# Patient Record
Sex: Male | Born: 1965 | Race: White | Hispanic: No | Marital: Married | Smoking: Current some day smoker
Health system: Southern US, Community
[De-identification: ages and names within clinical notes are randomized; demographics above are authoritative.]

## PROBLEM LIST (undated history)

## (undated) DIAGNOSIS — J449 Chronic obstructive pulmonary disease, unspecified: Secondary | ICD-10-CM

---

## 2016-12-27 ENCOUNTER — Emergency Department (HOSPITAL_COMMUNITY)
Admission: EM | Admit: 2016-12-27 | Discharge: 2016-12-27 | Disposition: A | Discharge status: Home / Self Care | Payer: Self-pay | Attending: Emergency Medicine | Admitting: Emergency Medicine

## 2016-12-27 ENCOUNTER — Emergency Department (HOSPITAL_COMMUNITY): Payer: Self-pay

## 2016-12-27 ENCOUNTER — Encounter (HOSPITAL_COMMUNITY): Payer: Self-pay

## 2016-12-27 DIAGNOSIS — M791 Myalgia: Secondary | ICD-10-CM | POA: Insufficient documentation

## 2016-12-27 DIAGNOSIS — Z720 Tobacco use: Secondary | ICD-10-CM | POA: Insufficient documentation

## 2016-12-27 DIAGNOSIS — J441 Chronic obstructive pulmonary disease with (acute) exacerbation: Secondary | ICD-10-CM | POA: Insufficient documentation

## 2016-12-27 DIAGNOSIS — Z79899 Other long term (current) drug therapy: Secondary | ICD-10-CM | POA: Insufficient documentation

## 2016-12-27 DIAGNOSIS — R509 Fever, unspecified: Secondary | ICD-10-CM | POA: Insufficient documentation

## 2016-12-27 LAB — BRAIN NATRIURETIC PEPTIDE: B NATRIURETIC PEPTIDE 5: 20 pg/mL (ref 0.0–100.0)

## 2016-12-27 LAB — COMPREHENSIVE METABOLIC PANEL
ALK PHOS: 55 U/L (ref 38–126)
ALT: 12 U/L — AB (ref 17–63)
AST: 17 U/L (ref 15–41)
Albumin: 3.8 g/dL (ref 3.5–5.0)
Anion gap: 9 (ref 5–15)
BUN: 12 mg/dL (ref 6–20)
CALCIUM: 9.1 mg/dL (ref 8.9–10.3)
CHLORIDE: 103 mmol/L (ref 101–111)
CO2: 26 mmol/L (ref 22–32)
CREATININE: 0.85 mg/dL (ref 0.61–1.24)
Glucose, Bld: 106 mg/dL — ABNORMAL HIGH (ref 65–99)
Potassium: 3.7 mmol/L (ref 3.5–5.1)
Sodium: 138 mmol/L (ref 135–145)
Total Bilirubin: 0.6 mg/dL (ref 0.3–1.2)
Total Protein: 7.6 g/dL (ref 6.5–8.1)

## 2016-12-27 LAB — CBC WITH DIFFERENTIAL/PLATELET
BASOS ABS: 0 10*3/uL (ref 0.0–0.1)
Basophils Relative: 0 %
Eosinophils Absolute: 0 10*3/uL (ref 0.0–0.7)
Eosinophils Relative: 0 %
HCT: 39.9 % (ref 39.0–52.0)
Hemoglobin: 14 g/dL (ref 13.0–17.0)
LYMPHS ABS: 1.9 10*3/uL (ref 0.7–4.0)
LYMPHS PCT: 11 %
MCH: 31.7 pg (ref 26.0–34.0)
MCHC: 35.1 g/dL (ref 30.0–36.0)
MCV: 90.5 fL (ref 78.0–100.0)
Monocytes Absolute: 3 10*3/uL — ABNORMAL HIGH (ref 0.1–1.0)
Monocytes Relative: 17 %
NEUTROS ABS: 13 10*3/uL — AB (ref 1.7–7.7)
Neutrophils Relative %: 72 %
PLATELETS: 273 10*3/uL (ref 150–400)
RBC: 4.41 MIL/uL (ref 4.22–5.81)
RDW: 12.6 % (ref 11.5–15.5)
WBC: 17.9 10*3/uL — AB (ref 4.0–10.5)

## 2016-12-27 LAB — TROPONIN I

## 2016-12-27 MED ORDER — PREDNISONE 20 MG PO TABS: 40.0000 mg | ORAL_TABLET | Freq: Every day | ORAL | 0 refills | Status: DC

## 2016-12-27 MED ORDER — DOXYCYCLINE HYCLATE 100 MG PO CAPS: 100.0000 mg | ORAL_CAPSULE | Freq: Two times a day (BID) | ORAL | 0 refills | Status: DC

## 2016-12-27 MED ORDER — METHYLPREDNISOLONE SODIUM SUCC 125 MG IJ SOLR
125.0000 mg | Freq: Once | INTRAMUSCULAR | Status: AC
  Administered 2016-12-27: 125 mg via INTRAVENOUS
  Filled 2016-12-27: qty 2

## 2016-12-27 MED ORDER — ALBUTEROL SULFATE HFA 108 (90 BASE) MCG/ACT IN AERS
2.0000 | INHALATION_SPRAY | RESPIRATORY_TRACT | Status: AC
  Administered 2016-12-27: 2 via RESPIRATORY_TRACT
  Filled 2016-12-27: qty 6.7

## 2016-12-27 MED ORDER — ALBUTEROL SULFATE (2.5 MG/3ML) 0.083% IN NEBU
5.0000 mg | INHALATION_SOLUTION | Freq: Once | RESPIRATORY_TRACT | Status: AC
  Administered 2016-12-27: 5 mg via RESPIRATORY_TRACT
  Filled 2016-12-27: qty 6

## 2016-12-27 MED ORDER — ALBUTEROL (5 MG/ML) CONTINUOUS INHALATION SOLN: 10.0000 mg/h | INHALATION_SOLUTION | RESPIRATORY_TRACT | Status: AC

## 2016-12-27 MED ORDER — DOXYCYCLINE HYCLATE 100 MG PO TABS
100.0000 mg | ORAL_TABLET | Freq: Once | ORAL | Status: AC
  Administered 2016-12-27: 100 mg via ORAL
  Filled 2016-12-27: qty 1

## 2016-12-27 NOTE — ED Notes (Signed)
Pt placed on 2L 02 [place] for 02 sat 92%.

## 2016-12-27 NOTE — Discharge Instructions (Signed)
Albuterol, 2 puffs every 4 hours for the next 24 hours, then every 4 hours as needed  Doxycycline, 100 mg by mouth twice daily for 10 days  Prednisone, 40 mg by mouth daily for 5 days.  Return for worsening stymtoms  RESOURCE GUIDE  [facility] Doctor List    Kari BaarsEdward Hawkins MD. Specialty: Pulmonary Disease Contact information: [address]  [address]  [place] [address]  [phone]   Syliva OvermanMargaret Simpson, MD. Specialty: [facility] Contact information: [address]  [place] [address]  [phone]   Lilyan PuntScott Luking, MD. Specialty: [facility] Contact information: [address]  [address]  [place] [address]  [phone]   Avon Gullyesfaye Fanta, MD Specialty: Internal Medicine Contact information: [address]  [place] [address]  [phone]   Catalina PizzaZach Hall, MD. Specialty: Internal Medicine Contact information: [address]  [place] [address]  [phone]   Butch PennyAngus Mcinnis, MD. Specialty: Family Medicine Contact information: [address]  [place] [address]  [phone]   John GiovanniStephen Knowlton, MD. Specialty: [facility] Contact information: [address]  [address]  [place] [address]  [phone]   Carylon Perchesoy Fagan, MD. Specialty: Internal Medicine Contact information: [address]  [address]  [place] [address]  [phone]

## 2016-12-27 NOTE — ED Notes (Signed)
To radiology

## 2016-12-27 NOTE — ED Triage Notes (Signed)
Reports of generalized fatigue, cough and fevers. Patient reports of feeling short of breath x2 days. Sats 92% on RA in triage.

## 2016-12-27 NOTE — ED Notes (Signed)
Pt reports SOB x 1 week- states that he is followed by urgent cares-[facility] currently

## 2016-12-27 NOTE — ED Provider Notes (Signed)
[facility] DEPT Provider Note   CSN: [id number] Arrival date & time: 12/27/16  1519     History   Chief Complaint Chief Complaint  Patient presents with  . Shortness of Breath  . Fever    HPI Jerry Schaefer is a 51 y.o. male.  HPI  The patient is a 51 year old male, he has a long-time history of smoking, approximately 60 pack years 30 years of smoking 2 packs per day. He denies any other significant medical problems. He presents after having increasing shortness of breath over the last week. This started as a dry cough, progressed and having some nasal congestion and drainage and is now having a productive cough, headache, body aches and fevers as high as 102 though today there were 99.5. There is no swelling of the legs, no vomiting but he does have a poor appetite. The symptoms are persistent, gradually worsening, nothing seems to make it better or worse, he has tried taking Goody powders at home and feels like it might help.  History reviewed. No pertinent past medical history.  There are no active problems to display for this patient.   History reviewed. No pertinent surgical history.     Home Medications    Prior to Admission medications   Medication Sig Start Date End Date Taking? Authorizing Provider  albuterol (PROVENTIL HFA;VENTOLIN HFA) 108 (90 Base) MCG/ACT inhaler Inhale 1-2 puffs into the lungs every 6 (six) hours as needed for wheezing or shortness of breath.    Yes [provider]  Aspirin-Acetaminophen-Caffeine (GOODY HEADACHE PO) Take 2 packets by mouth 4 (four) times daily as needed (pain).    Yes [provider]  naproxen sodium (ANAPROX) 220 MG tablet Take 440 mg by mouth every 4 (four) hours as needed.    Yes [provider]  doxycycline (VIBRAMYCIN) 100 MG capsule Take 1 capsule (100 mg total) by mouth 2 (two) times daily. 12/27/16   Eber HongMiller, Shreeya Recendiz, MD  predniSONE (DELTASONE) 20 MG tablet Take 2 tablets (40 mg total) by mouth  daily. 12/27/16   Eber HongMiller, Florencia Zaccaro, MD    Family History History reviewed. No pertinent family history.  Social History Social History  Substance Use Topics  . Smoking status: Current Every Day Smoker    Packs/day: 2.00    Types: Cigarettes  . Smokeless tobacco: Never Used  . Alcohol use No     Allergies   Patient has no known allergies.   Review of Systems Review of Systems  All other systems reviewed and are negative.    Physical Exam Updated Vital Signs BP 103/72 (BP Location: Left Arm)   Pulse 80   Temp 98.2 F (36.8 C) (Oral)   Resp 16   Ht 5\' 9"  (1.753 m)   Wt 61.2 kg (135 lb)   SpO2 94%   BMI 19.94 kg/m   Physical Exam  Constitutional: He appears well-developed and well-nourished. No distress.  HENT:  Head: Normocephalic and atraumatic.  Mouth/Throat: Oropharynx is clear and moist. No oropharyngeal exudate.  Eyes: Conjunctivae and EOM are normal. Pupils are equal, round, and reactive to light. Right eye exhibits no discharge. Left eye exhibits no discharge. No scleral icterus.  Neck: Normal range of motion. Neck supple. No JVD present. No thyromegaly present.  No JVD  Cardiovascular: Normal rate, regular rhythm, normal heart sounds and intact distal pulses.  Exam reveals no gallop and no friction rub.   No murmur heard. Pulse of 95, strong pulses at the radial arteries, no peripheral edema  Pulmonary/Chest: No respiratory distress. He has wheezes. He has no rales.  Wheezing on forced expiration, decreased lung sounds bilaterally, speaks in full sentences, no increased work of breathing or accessory muscle use  Abdominal: Soft. Bowel sounds are normal. He exhibits no distension and no mass. There is no tenderness.  Soft nontender abdomen with no hepatosplenic megaly  Musculoskeletal: Normal range of motion. He exhibits no edema or tenderness.  Lymphadenopathy:    He has no cervical adenopathy.  Neurological: He is alert. Coordination normal.  Skin: Skin is  warm and dry. No rash noted. No erythema.  Psychiatric: He has a normal mood and affect. His behavior is normal.  Nursing note and vitals reviewed.    ED Treatments / Results  Labs (all labs ordered are listed, but only abnormal results are displayed) Labs Reviewed  CBC WITH DIFFERENTIAL/PLATELET - Abnormal; Notable for the following:       Result Value   WBC 17.9 (*)    Neutro Abs 13.0 (*)    Monocytes Absolute 3.0 (*)    All other components within normal limits  COMPREHENSIVE METABOLIC PANEL - Abnormal; Notable for the following:    Glucose, Bld 106 (*)    ALT 12 (*)    All other components within normal limits  BRAIN NATRIURETIC PEPTIDE  TROPONIN I    EKG  EKG Interpretation  Date/Time:  Friday December 27 2016 15:33:29 EDT Ventricular Rate:  96 PR Interval:    QRS Duration: 95 QT Interval:  346 QTC Calculation: 438 R Axis:   90 Text Interpretation:  Sinus rhythm Borderline right axis deviation ECG OTHERWISE WITHIN NORMAL LIMITS No old tracing to compare Confirmed by Eber Hong ([id number]) on 12/27/2016 4:01:21 PM       Radiology Dg Chest 2 View  Result Date: 12/27/2016 CLINICAL DATA:  Productive cough and fever x1 week. Dyspnea x2 days. Generalized fatigue. EXAM: CHEST  2 VIEW COMPARISON:  None. FINDINGS: The heart size and mediastinal contours are within normal limits. Minimal aortic atherosclerosis at the arch. Emphysematous hyperinflation of the lungs. No pneumonic consolidation, effusion or pneumothorax. No pulmonary edema. Degenerative changes are seen along the dorsal spine. No acute nor suspicious osseous abnormality. IMPRESSION: Emphysematous hyperinflation of the lungs.  Aortic atherosclerosis. Electronically Signed   By: Tollie Eth M.D.   On: 12/27/2016 17:20    Procedures Procedures (including critical care time)  Medications Ordered in ED Medications  albuterol (PROVENTIL,VENTOLIN) solution continuous neb (10 mg/hr Nebulization Not Given 12/27/16 1600)    albuterol (PROVENTIL HFA;VENTOLIN HFA) 108 (90 Base) MCG/ACT inhaler 2 puff (not administered)  albuterol (PROVENTIL) (2.5 MG/3ML) 0.083% nebulizer solution 5 mg (5 mg Nebulization Given 12/27/16 1559)  methylPREDNISolone sodium succinate (SOLU-MEDROL) 125 mg/2 mL injection 125 mg (125 mg Intravenous Given 12/27/16 1617)  doxycycline (VIBRA-TABS) tablet 100 mg (100 mg Oral Given 12/27/16 1832)     Initial Impression / Assessment and Plan / ED Course  I have reviewed the triage vital signs and the nursing notes.  Pertinent labs & imaging results that were available during my care of the patient were reviewed by me and considered in my medical decision making (see chart for details).    The patient's exam is consistent with COPD, his lung exam is also consistent with that, his chest x-ray is also consistent with that.  CBC reveals a leukocytosis with a slight leftward shift. Given his increased phlegm production will treat with doxycycline, prednisone, albuterol. He has been given an inhaler and a spacer  prior to discharge. The patient expresses understanding of all of the results, he appears stable for discharge. He is not requiring oxygen  Final Clinical Impressions(s) / ED Diagnoses   Final diagnoses:  COPD exacerbation (HCC)    New Prescriptions New Prescriptions   DOXYCYCLINE (VIBRAMYCIN) 100 MG CAPSULE    Take 1 capsule (100 mg total) by mouth 2 (two) times daily.   PREDNISONE (DELTASONE) 20 MG TABLET    Take 2 tablets (40 mg total) by mouth daily.     Eber Hong, MD 12/27/16 [place]

## 2016-12-27 NOTE — ED Notes (Signed)
EKG given to Dr. Miller.  

## 2016-12-27 NOTE — ED Notes (Signed)
Amublated pt around nurses station. Pulse ox remained consistent at 90% during ambulation

## 2016-12-27 NOTE — ED Notes (Signed)
Dr Miller in to assess 

## 2016-12-27 NOTE — ED Notes (Signed)
From radiology 

## 2019-02-19 ENCOUNTER — Ambulatory Visit (HOSPITAL_COMMUNITY)
Admission: RE | Admit: 2019-02-19 | Discharge: 2019-02-19 | Disposition: A | Discharge status: Home / Self Care | Payer: PRIVATE HEALTH INSURANCE | Source: Ambulatory Visit | Attending: Pulmonary Disease | Admitting: Pulmonary Disease

## 2019-02-19 ENCOUNTER — Other Ambulatory Visit: Payer: Self-pay

## 2019-02-19 ENCOUNTER — Other Ambulatory Visit (HOSPITAL_COMMUNITY): Payer: Self-pay | Admitting: Pulmonary Disease

## 2019-02-19 DIAGNOSIS — J42 Unspecified chronic bronchitis: Secondary | ICD-10-CM | POA: Diagnosis present

## 2020-07-13 IMAGING — DX CHEST - 2 VIEW
2 series · 2 of 2 positions shown · non-contrast
Comparison: 12/27/2016

CLINICAL DATA: Shortness of breath.  History of COPD.

EXAM:
CHEST - 2 VIEW

[chest pa]
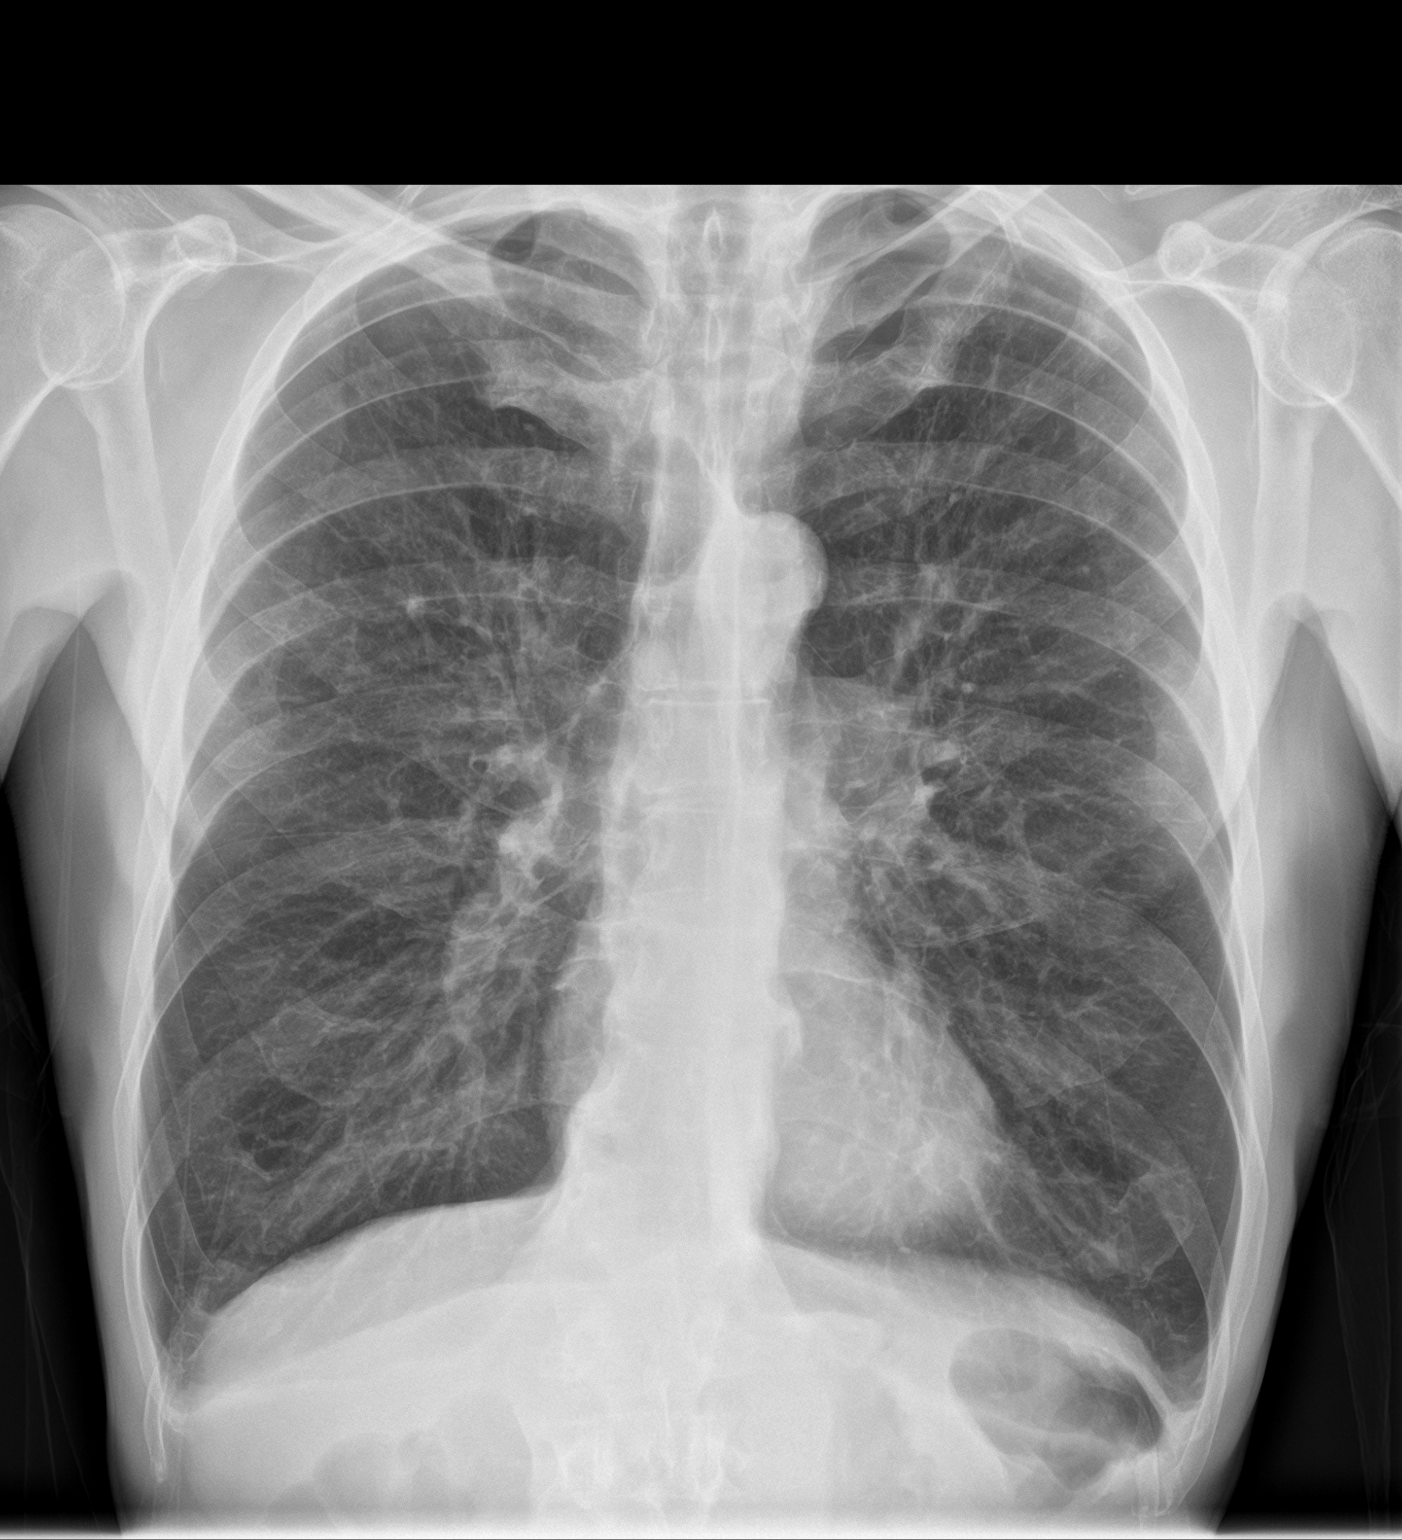

[chest lat]
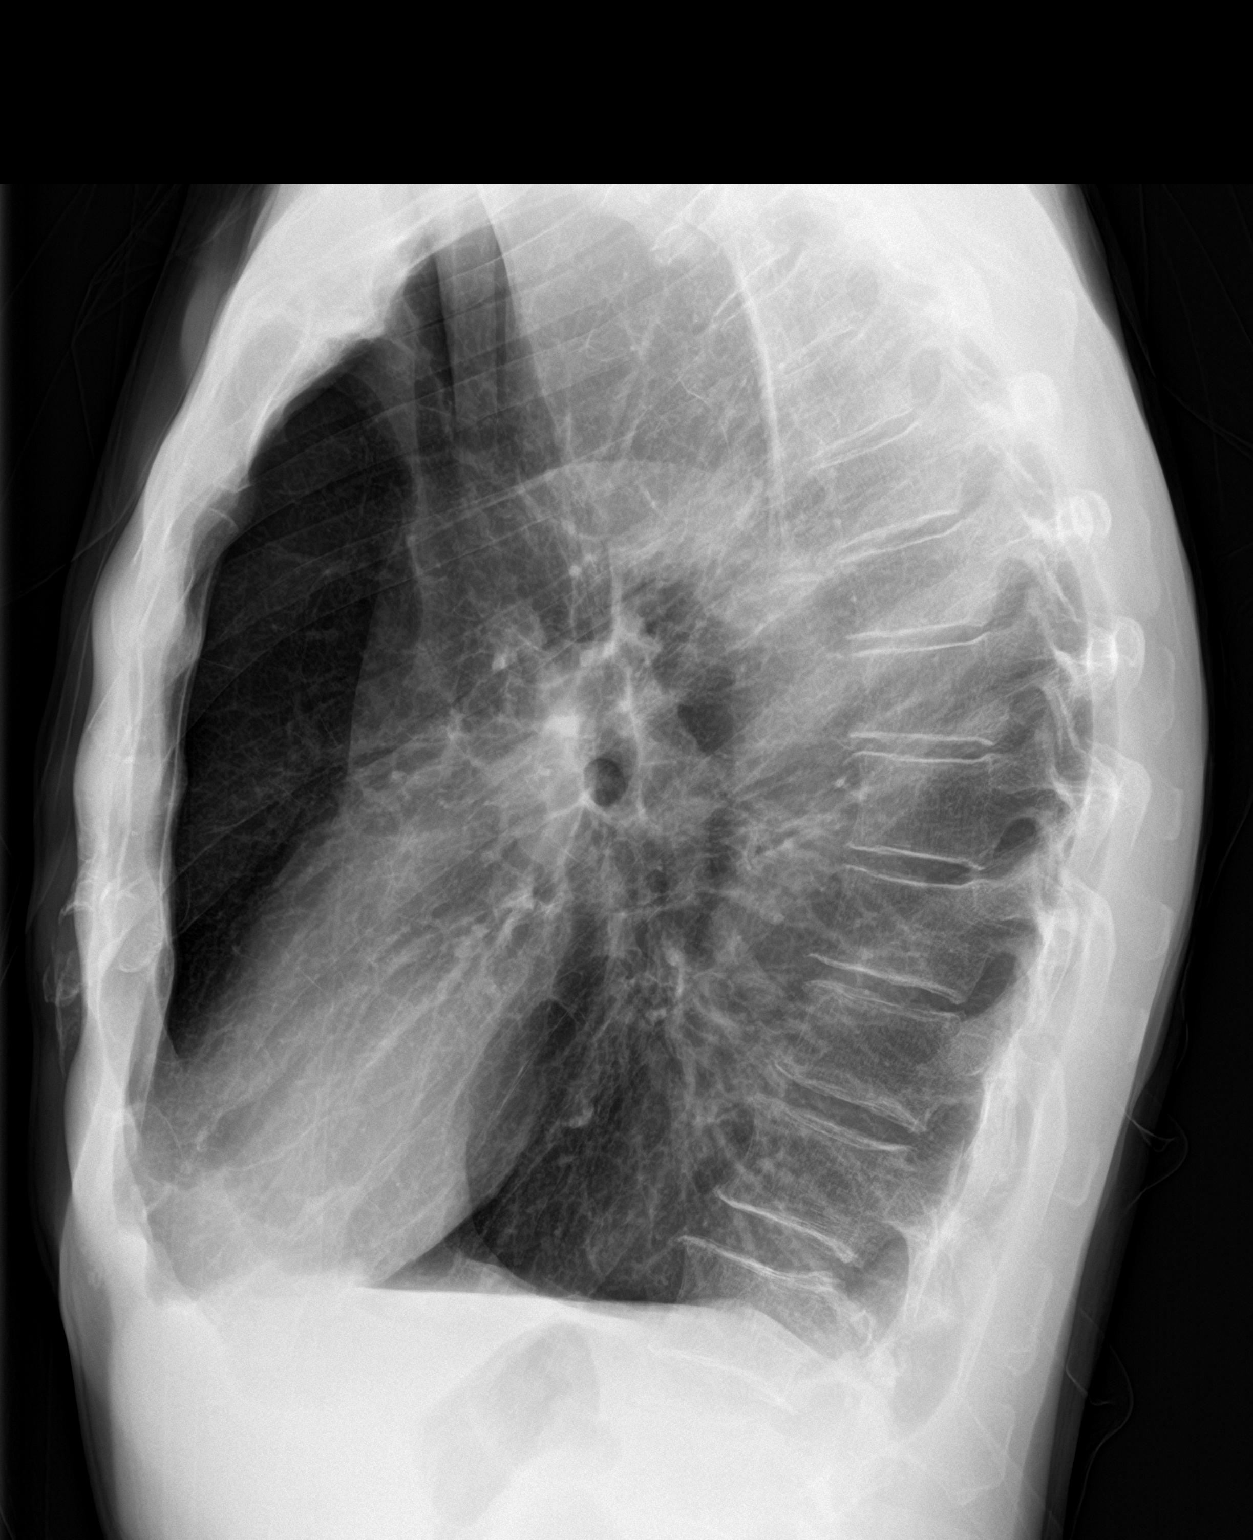

[2 of 2 positions shown; findings below may reference images not displayed]

FINDINGS: Cardiomediastinal silhouette is unchanged.

COPD/emphysema changes again noted.

There is no evidence of focal airspace disease, pulmonary edema,
suspicious pulmonary nodule/mass, pleural effusion, or pneumothorax.

No acute bony abnormalities are identified.
IMPRESSION: COPD/emphysema without evidence of acute cardiopulmonary disease.

## 2021-12-25 ENCOUNTER — Encounter: Payer: Self-pay | Admitting: Family Medicine

## 2021-12-25 ENCOUNTER — Ambulatory Visit (INDEPENDENT_AMBULATORY_CARE_PROVIDER_SITE_OTHER): Payer: BC Managed Care – PPO | Admitting: Family Medicine

## 2021-12-25 VITALS — BP 114/62 | HR 88 | Ht 69.0 in | Wt 117.2 lb

## 2021-12-25 DIAGNOSIS — Z1159 Encounter for screening for other viral diseases: Secondary | ICD-10-CM | POA: Diagnosis not present

## 2021-12-25 DIAGNOSIS — J449 Chronic obstructive pulmonary disease, unspecified: Secondary | ICD-10-CM | POA: Diagnosis not present

## 2021-12-25 DIAGNOSIS — Z23 Encounter for immunization: Secondary | ICD-10-CM | POA: Diagnosis not present

## 2021-12-25 DIAGNOSIS — R7301 Impaired fasting glucose: Secondary | ICD-10-CM | POA: Diagnosis not present

## 2021-12-25 DIAGNOSIS — E559 Vitamin D deficiency, unspecified: Secondary | ICD-10-CM

## 2021-12-25 DIAGNOSIS — Z1211 Encounter for screening for malignant neoplasm of colon: Secondary | ICD-10-CM

## 2021-12-25 DIAGNOSIS — Z114 Encounter for screening for human immunodeficiency virus [HIV]: Secondary | ICD-10-CM

## 2021-12-25 DIAGNOSIS — F17210 Nicotine dependence, cigarettes, uncomplicated: Secondary | ICD-10-CM | POA: Insufficient documentation

## 2021-12-25 DIAGNOSIS — J441 Chronic obstructive pulmonary disease with (acute) exacerbation: Secondary | ICD-10-CM | POA: Insufficient documentation

## 2021-12-25 DIAGNOSIS — Z720 Tobacco use: Secondary | ICD-10-CM

## 2021-12-25 MED ORDER — ALBUTEROL SULFATE HFA 108 (90 BASE) MCG/ACT IN AERS: 1.0000 | INHALATION_SPRAY | Freq: Four times a day (QID) | RESPIRATORY_TRACT | 2 refills | Status: DC | PRN

## 2021-12-25 MED ORDER — AZITHROMYCIN 250 MG PO TABS: 500.0000 mg | ORAL_TABLET | Freq: Every day | ORAL | 0 refills | Status: AC

## 2021-12-25 MED ORDER — PREDNISONE 20 MG PO TABS: 40.0000 mg | ORAL_TABLET | Freq: Every day | ORAL | 0 refills | Status: DC

## 2021-12-25 NOTE — Patient Instructions (Addendum)
I appreciate the opportunity to provide care to you today!    Follow up:  3 months  Labs: please stop by the lab today to get your blood drawn (CBC, CMP, TSH, Lipid profile, HgA1c, Vit D)  Screening: HIV,  Hep C  Please pick up your medications at the pharmacy   Thank you for getting your Tdap vaccine     Please continue to a heart-healthy diet and increase your physical activities. Try to exercise for 64mins at least three times a week.      It was a pleasure to see you and I look forward to continuing to work together on your health and well-being. Please do not hesitate to call the office if you need care or have questions about your care.   Have a wonderful day and week. With Gratitude, Alvira Monday MSN, FNP-BC

## 2021-12-25 NOTE — Assessment & Plan Note (Signed)
-   Will treat with a 5-day course of prednisone and 4-days course of antibiotics -Refill albuterol inhaler -will place a referral to pulmonary for follow-up

## 2021-12-25 NOTE — Assessment & Plan Note (Signed)
-  Reports 40 years history. He used to smoke 2 packs daily but has cut back to 15 cigarettes daily.   Smokes about 15 cigarettes daily  Asked about quitting: confirms that he/she currently smokes cigarettes Advise to quit smoking: Educated about QUITTING to reduce the risk of cancer, cardio and cerebrovascular disease. Assess willingness: Unwilling to quit at this time, but is working on cutting back. Assist with counseling and pharmacotherapy: Counseled for 5 minutes and literature provided. Arrange for follow up: follow up in 3 months and continue to offer help.

## 2021-12-25 NOTE — Progress Notes (Addendum)
New Patient Office Visit  Subjective:  Patient ID: Jerry Schaefer, male    DOB: [birth year]  Age: 56 y.o. MRN: [id number]  CC:  Chief Complaint  Patient presents with   New Patient (Initial Visit)    Pt would like to discuss COPD, has been having flare up.     HPI Jerry Schaefer is a 56 y.o. male with past medical history of COPD presents for establishing care. COPD exacerbation:  reports having trouble breathing for a while, stating that he can burn up an inhaler in a week and needs refills. He notes worsening of his respiratory symptoms beyond typical day-to-day variations, reporting increased dyspnea, cough, and wheezing. He mentions using his nebulizer with moderate relief. He denies fever and chills, noting sleep disturbances due to current episodes. He reports seeing his pulmonologist, Dr. Luan Pulling but he has not been followed up since 2020.   Smoker: 40 years history. He used to smoke 2 packs daily but has cut back to 15 cigarettes daily.   He declined the shingles vaccine day but will be taking the Tdap vaccine.     History reviewed. No pertinent past medical history.  History reviewed. No pertinent surgical history.  History reviewed. No pertinent family history.  Social History   Socioeconomic History   Marital status: Married    Spouse name: Not on file   Number of children: Not on file   Years of education: Not on file   Highest education level: Not on file  Occupational History   Not on file  Tobacco Use   Smoking status: Every Day    Packs/day: 0.50    Types: Cigarettes   Smokeless tobacco: Never  Substance and Sexual Activity   Alcohol use: No   Drug use: No   Sexual activity: Not on file  Other Topics Concern   Not on file  Social History Narrative   Not on file   Social Determinants of Health   Financial Resource Strain: Not on file  Food Insecurity: Not on file  Transportation Needs: Not on file  Physical Activity: Not on file  Stress: Not  on file  Social Connections: Not on file  Intimate Partner Violence: Not on file    ROS Review of Systems  Constitutional:  Positive for fatigue. Negative for chills and fever.  HENT:  Negative for sinus pressure, sinus pain, sneezing and sore throat.   Eyes:  Negative for photophobia, pain and redness.  Respiratory:  Positive for cough, chest tightness, shortness of breath and wheezing.   Cardiovascular:  Negative for chest pain, palpitations and leg swelling.  Gastrointestinal:  Negative for diarrhea and vomiting.  Endocrine: Negative for polydipsia and polyphagia.  Musculoskeletal:  Negative for back pain and neck pain.  Skin:  Negative for rash.  Neurological:  Negative for dizziness, seizures and speech difficulty.  Hematological:  Does not bruise/bleed easily.  Psychiatric/Behavioral:  Positive for sleep disturbance.    Objective:   Today's Vitals: BP 114/62   Pulse 88   Ht _0  (1.753 m)   Wt 117 lb 3.2 oz (53.2 kg)   SpO2 90%   BMI 17.31 kg/m   Physical Exam Constitutional:      Appearance: Normal appearance.  HENT:     Head: Normocephalic.     Right Ear: External ear normal.     Left Ear: External ear normal.     Nose: No congestion or rhinorrhea.     Mouth/Throat:     Mouth: Mucous membranes  are moist.  Eyes:     Extraocular Movements: Extraocular movements intact.     Pupils: Pupils are equal, round, and reactive to light.  Cardiovascular:     Rate and Rhythm: Normal rate and regular rhythm.     Pulses: Normal pulses.     Heart sounds: Normal heart sounds.  Pulmonary:     Effort: Pulmonary effort is normal.     Breath sounds: Wheezing (Wheezing on forced expiration, decreased lung sounds bilaterally, speaks in complete sentences, no increased work of breathing or accessory muscle use) present.  Abdominal:     Palpations: Abdomen is soft.  Musculoskeletal:     Cervical back: No rigidity or tenderness.     Right lower leg: No edema.     Left lower leg:  No edema.  Skin:    Capillary Refill: Capillary refill takes less than 2 seconds.     Findings: No bruising or lesion.  Neurological:     Mental Status: He is alert and oriented to person, place, and time.  Psychiatric:     Comments: Normal affect    Assessment & Plan:   Problem List Items Addressed This Visit       Respiratory   COPD with acute exacerbation ([place])    - Will treat with a 5-day course of prednisone and 4-days course of antibiotics -Refill albuterol inhaler -will place a referral to pulmonary for follow-up       Relevant Medications   albuterol (VENTOLIN HFA) 108 (90 Base) MCG/ACT inhaler   predniSONE (DELTASONE) 20 MG tablet   azithromycin (ZITHROMAX) 250 MG tablet     Other   Tobacco use    -Reports 40 years history. He used to smoke 2 packs daily but has cut back to 15 cigarettes daily.   Smokes about 15 cigarettes daily  Asked about quitting: confirms that he/she currently smokes cigarettes Advise to quit smoking: Educated about QUITTING to reduce the risk of cancer, cardio and cerebrovascular disease. Assess willingness: Unwilling to quit at this time, but is working on cutting back. Assist with counseling and pharmacotherapy: Counseled for 5 minutes and literature provided. Arrange for follow up: follow up in 3 months and continue to offer help.      Other Visit Diagnoses     Colon cancer screening    -  Primary   Relevant Orders   Cologuard   COPD mixed type ([place])       Relevant Medications   albuterol (VENTOLIN HFA) 108 (90 Base) MCG/ACT inhaler   predniSONE (DELTASONE) 20 MG tablet   azithromycin (ZITHROMAX) 250 MG tablet   Other Relevant Orders   CBC with Differential/Platelet   CMP14+EGFR   Lipid panel   TSH + free T4   Ambulatory referral to Pulmonology   Need for Tdap vaccination       Relevant Orders   Tdap vaccine greater than or equal to 7yo IM (Completed)   Need for hepatitis C screening test       Relevant Orders    Hepatitis C Antibody   Encounter for screening for HIV       Relevant Orders   HIV antibody (with reflex)   Vitamin D deficiency       Relevant Orders   Vitamin D (25 hydroxy)   IFG (impaired fasting glucose)       Relevant Orders   Hemoglobin A1C       Outpatient Encounter Medications as of 12/25/2021  Medication Sig   azithromycin (ZITHROMAX)  250 MG tablet Take 2 tablets (500 mg total) by mouth daily for 4 days. Take 2 tablets on day 1, then 1 tablet daily on days 2 through 5   [DISCONTINUED] albuterol (PROVENTIL HFA;VENTOLIN HFA) 108 (90 Base) MCG/ACT inhaler Inhale 1-2 puffs into the lungs every 6 (six) hours as needed for wheezing or shortness of breath.    albuterol (VENTOLIN HFA) 108 (90 Base) MCG/ACT inhaler Inhale 1-2 puffs into the lungs every 6 (six) hours as needed for wheezing or shortness of breath.   Aspirin-Acetaminophen-Caffeine (GOODY HEADACHE PO) Take 2 packets by mouth 4 (four) times daily as needed (pain).  (Patient not taking: Reported on 12/25/2021)   doxycycline (VIBRAMYCIN) 100 MG capsule Take 1 capsule (100 mg total) by mouth 2 (two) times daily. (Patient not taking: Reported on 12/25/2021)   naproxen sodium (ANAPROX) 220 MG tablet Take 440 mg by mouth every 4 (four) hours as needed.  (Patient not taking: Reported on 12/25/2021)   predniSONE (DELTASONE) 20 MG tablet Take 2 tablets (40 mg total) by mouth daily.   [DISCONTINUED] predniSONE (DELTASONE) 20 MG tablet Take 2 tablets (40 mg total) by mouth daily. (Patient not taking: Reported on 12/25/2021)   No facility-administered encounter medications on file as of 12/25/2021.    Follow-up: Return in about 3 months (around 03/27/2022).   Alvira Monday, FNP

## 2021-12-26 LAB — CMP14+EGFR
ALT: 10 IU/L (ref 0–44)
AST: 14 IU/L (ref 0–40)
Albumin/Globulin Ratio: 1.7 (ref 1.2–2.2)
Albumin: 4.2 g/dL (ref 3.8–4.9)
Alkaline Phosphatase: 68 IU/L (ref 44–121)
BUN/Creatinine Ratio: 13 (ref 9–20)
BUN: 11 mg/dL (ref 6–24)
Bilirubin Total: 0.2 mg/dL (ref 0.0–1.2)
CO2: 25 mmol/L (ref 20–29)
Calcium: 9.4 mg/dL (ref 8.7–10.2)
Chloride: 100 mmol/L (ref 96–106)
Creatinine, Ser: 0.86 mg/dL (ref 0.76–1.27)
Globulin, Total: 2.5 g/dL (ref 1.5–4.5)
Glucose: 111 mg/dL — ABNORMAL HIGH (ref 70–99)
Potassium: 5.1 mmol/L (ref 3.5–5.2)
Sodium: 138 mmol/L (ref 134–144)
Total Protein: 6.7 g/dL (ref 6.0–8.5)
eGFR: 103 mL/min/{1.73_m2} (ref >59)

## 2021-12-26 LAB — CBC WITH DIFFERENTIAL/PLATELET
Basophils Absolute: 0.1 10*3/uL (ref 0.0–0.2)
Basos: 1 %
EOS (ABSOLUTE): 0.4 10*3/uL (ref 0.0–0.4)
Eos: 5 %
Hematocrit: 45.1 % (ref 37.5–51.0)
Hemoglobin: 15.5 g/dL (ref 13.0–17.7)
Immature Grans (Abs): 0 10*3/uL (ref 0.0–0.1)
Immature Granulocytes: 0 %
Lymphocytes Absolute: 1.8 10*3/uL (ref 0.7–3.1)
Lymphs: 22 %
MCH: 32.2 pg (ref 26.6–33.0)
MCHC: 34.4 g/dL (ref 31.5–35.7)
MCV: 94 fL (ref 79–97)
Monocytes Absolute: 1.1 10*3/uL — ABNORMAL HIGH (ref 0.1–0.9)
Monocytes: 13 %
Neutrophils Absolute: 5.1 10*3/uL (ref 1.4–7.0)
Neutrophils: 59 %
Platelets: 250 10*3/uL (ref 150–450)
RBC: 4.81 x10E6/uL (ref 4.14–5.80)
RDW: 12 % (ref 11.6–15.4)
WBC: 8.5 10*3/uL (ref 3.4–10.8)

## 2021-12-26 LAB — LIPID PANEL
Chol/HDL Ratio: 2.7 ratio (ref 0.0–5.0)
Cholesterol, Total: 115 mg/dL (ref 100–199)
HDL: 43 mg/dL (ref >39)
LDL Chol Calc (NIH): 57 mg/dL (ref 0–99)
Triglycerides: 71 mg/dL (ref 0–149)
VLDL Cholesterol Cal: 15 mg/dL (ref 5–40)

## 2021-12-26 LAB — TSH+FREE T4
Free T4: 1.33 ng/dL (ref 0.82–1.77)
TSH: 0.744 u[IU]/mL (ref 0.450–4.500)

## 2021-12-26 LAB — HEMOGLOBIN A1C
Est. average glucose Bld gHb Est-mCnc: 137 mg/dL
Hgb A1c MFr Bld: 6.4 % — ABNORMAL HIGH (ref 4.8–5.6)

## 2021-12-26 LAB — HEPATITIS C ANTIBODY: Hep C Virus Ab: NONREACTIVE

## 2021-12-26 LAB — VITAMIN D 25 HYDROXY (VIT D DEFICIENCY, FRACTURES): Vit D, 25-Hydroxy: 35.3 ng/mL (ref 30.0–100.0)

## 2021-12-26 LAB — HIV ANTIBODY (ROUTINE TESTING W REFLEX): HIV Screen 4th Generation wRfx: NONREACTIVE

## 2021-12-26 NOTE — Progress Notes (Signed)
Please inform the patient that he has prediabetes. I recommend  low carbs and sugar diet, and I will reassess it at his next visit. Lifestyle changes include weight loss and exercising at least 3 days a week for 150 min weekly.

## 2022-02-11 ENCOUNTER — Encounter: Payer: Self-pay | Admitting: Internal Medicine

## 2022-02-11 ENCOUNTER — Ambulatory Visit (INDEPENDENT_AMBULATORY_CARE_PROVIDER_SITE_OTHER): Payer: BC Managed Care – PPO | Admitting: Internal Medicine

## 2022-02-11 DIAGNOSIS — J449 Chronic obstructive pulmonary disease, unspecified: Secondary | ICD-10-CM | POA: Diagnosis not present

## 2022-02-11 MED ORDER — TRELEGY ELLIPTA 100-62.5-25 MCG/ACT IN AEPB: 1.0000 | INHALATION_SPRAY | Freq: Every day | RESPIRATORY_TRACT | 11 refills | Status: DC

## 2022-02-11 NOTE — Progress Notes (Signed)
Jerry Schaefer, male    DOB: 29-May-1966    MRN: [id number]   Brief patient profile:  56  yowm truck driver  ? quit smoking 07/4479  referred to pulmonary clinic in [facility]  02/11/2022 by Sharen Hones for copd eval / had seen Dr [name]  rx [name] seemed to help in past, saba dep at 1st pulmonary eval    History of Present Illness  02/11/2022  Pulmonary/ 1st office eval/ [name] / [name] no maint rx and last aecopd was 12/25/21  Chief Complaint  Patient presents with   Consult    Used to see Dr. [name] for COPD   Dyspnea:  walking at [employer] is a struggle  Cough: better p aecopd rx with doxy Sleep: level bed/ 2 pillows  SABA use: 2 puffs x 3 per day and neb up to bid   No obvious patterns in day to day or daytime /variability or assoc excess/ purulent sputum or mucus plugs or hemoptysis or cp or chest tightness, subjective wheeze or overt sinus or hb symptoms.   Sleeping as above without nocturnal  or early am exacerbation  of respiratory  c/o's or need for noct saba. Also denies any obvious fluctuation of symptoms with weather or environmental changes or other aggravating or alleviating factors except as outlined above   No unusual exposure hx or h/o childhood pna/ asthma or knowledge of premature birth.  Current Allergies, Complete Past Medical History, Past Surgical History, Family History, and Social History were reviewed in [name].  ROS  The following are not active complaints unless bolded Hoarseness, sore throat, dysphagia, dental problems, itching, sneezing,  nasal congestion or discharge of excess mucus or purulent secretions, ear ache,   fever, chills, sweats, unintended wt loss or wt gain, classically pleuritic or exertional cp,  orthopnea pnd or arm/hand swelling  or leg swelling, presyncope, palpitations, abdominal pain, anorexia, nausea, vomiting, diarrhea  or change in bowel habits or change in bladder habits, change in stools or  change in urine, dysuria, hematuria,  rash, arthralgias, visual complaints, headache, numbness, weakness or ataxia or problems with walking or coordination,  change in mood or  memory.           No past medical history on file.  Outpatient Medications Prior to Visit  Medication Sig Dispense Refill   albuterol (VENTOLIN HFA) 108 (90 Base) MCG/ACT inhaler Inhale 1-2 puffs into the lungs every 6 (six) hours as needed for wheezing or shortness of breath. 8 g 2   Aspirin-Acetaminophen-Caffeine (GOODY HEADACHE PO) Take 2 packets by mouth 4 (four) times daily as needed (pain).     naproxen sodium (ANAPROX) 220 MG tablet Take 440 mg by mouth every 4 (four) hours as needed.     predniSONE (DELTASONE) 20 MG tablet Take 2 tablets (40 mg total) by mouth daily. 10 tablet 0   doxycycline (VIBRAMYCIN) 100 MG capsule Take 1 capsule (100 mg total) by mouth 2 (two) times daily. 20 capsule 0   No facility-administered medications prior to visit.     Objective:     BP 122/74 (BP Location: Left Arm, Patient Position: Sitting)   Pulse 68   Temp 98.4 F (36.9 C) (Temporal)   Ht 5\' 9"  (1.753 m)   Wt 119 lb 6.4 oz (54.2 kg)   SpO2 95% Comment: ra  BMI 17.63 kg/m   SpO2: 95 % (ra)  Amb wm nad  HEENT :  Oropharynx  clear  Nasal turbinates nl  NECK :  without JVD/Nodes/TM/ nl carotid upstrokes bilaterally   LUNGS: no acc muscle use,  Mod barrel  contour chest wall with bilateral  Distant bs s audible wheeze and  without cough on insp or exp maneuvers and mod  Hyperresonant  to  percussion bilaterally     CV:  RRR  no s3 or murmur or increase in P2, and no edema   ABD:  soft and nontender with pos mid insp Hoover's  in the supine position. No bruits or organomegaly appreciated, bowel sounds nl  MS:   Ext warm without deformities or   obvious joint restrictions , calf tenderness, cyanosis or clubbing  SKIN: warm and dry without lesions    NEURO:  alert, approp, nl sensorium with  no motor or  cerebellar deficits apparent.              Assessment   COPD GOLD ?  ? Quit smoking 12/2021  -  Allergy screen  12/25/21  >  Eos 0.4 - Labs ordered 02/11/2022  :    alpha one AT phenotype   - 02/11/2022  After extensive coaching inhaler device,  effectiveness =  90% with dpi/ elipta > try trelegy 100 one click each am and f/u with pfts   Group D (now reclassified as E) in terms of symptom/risk and laba/lama/ICS  therefore appropriate rx at this point >>>  trelegy 100 and more approp (hopefully much less ) use of saba:  Re SABA :  I spent extra time with pt today reviewing appropriate use of albuterol for prn use on exertion with the following points: 1) saba is for relief of sob that does not improve by walking a slower pace or resting but rather if the pt does not improve after trying this first. 2) If the pt is convinced, as many are, that saba helps recover from activity faster then it's easy to tell if this is the case by re-challenging : ie stop, take the inhaler, then p 5 minutes try the exact same activity (intensity of workload) that just caused the symptoms and see if they are substantially diminished or not after saba 3) if there is an activity that reproducibly causes the symptoms, try the saba 15 min before the activity on alternate days   If in fact the saba really does help, then fine to continue to use it prn but advised may need to look closer at the maintenance regimen being used to achieve better control of airways disease with exertion.   Emphasized:  maint off cigs if at all possible  F/u with pfts in 3 m, call sooner if needed           Each maintenance medication was reviewed in detail including emphasizing most importantly the difference between maintenance and prns and under what circumstances the prns are to be triggered using an action plan format where appropriate.  Total time for H and P, chart review, counseling, reviewing hfa/dpi/neb  device(s) and  generating customized AVS unique to this office visit / same day charting  > 30 min new pt eval.             Sandrea Hughs, MD 02/11/2022

## 2022-02-11 NOTE — Patient Instructions (Addendum)
Plan A = Automatic = Always=    Trelegy 100 one click take 2 good drags each am then rinse and gargle  Plan B = Backup (to supplement plan A, not to replace it) Only use your albuterol inhaler as a rescue medication to be used if you can't catch your breath by resting   - The less you use it, the better it will work when you need it. - Ok to use the inhaler up to 2 puffs  every 4 hours if you must but call for appointment if use goes up over your usual need - Don't leave home without it !!  (think of it like the spare tire for your car)   Plan C = Crisis (instead of Plan B but only if Plan B stops working) - only use your albuterol nebulizer if you first try Plan B and it fails to help > ok to use the nebulizer up to every 4 hours but if start needing it regularly call for immediate appointment  Ok to try albuterol 15 min before an activity (on alternating days)  that you know would usually make you short of breath and see if it makes any difference and if makes none then don't take albuterol after activity unless you can't catch your breath as this means it's the resting that helps, not the albuterol.      Please remember to go to the lab department   for your tests - we will call you with the results when they are available.     My office will be contacting you by phone for referral to radiology at [facility] for low dose CT chest    Please schedule a follow up visit in 3 months but call sooner if needed with pfts

## 2022-02-11 NOTE — Assessment & Plan Note (Signed)
?   Quit smoking 12/2021  -  Allergy screen  12/25/21  >  Eos 0.4 - Labs ordered 02/11/2022  :    alpha one AT phenotype   - 02/11/2022  After extensive coaching inhaler device,  effectiveness =  90% with dpi/ elipta > try trelegy 100 one click each am and f/u with pfts   Group D (now reclassified as E) in terms of symptom/risk and laba/lama/ICS  therefore appropriate rx at this point >>>  trelegy 100 and more approp (hopefully much less ) use of saba:  Re SABA :  I spent extra time with pt today reviewing appropriate use of albuterol for prn use on exertion with the following points: 1) saba is for relief of sob that does not improve by walking a slower pace or resting but rather if the pt does not improve after trying this first. 2) If the pt is convinced, as many are, that saba helps recover from activity faster then it's easy to tell if this is the case by re-challenging : ie stop, take the inhaler, then p 5 minutes try the exact same activity (intensity of workload) that just caused the symptoms and see if they are substantially diminished or not after saba 3) if there is an activity that reproducibly causes the symptoms, try the saba 15 min before the activity on alternate days   If in fact the saba really does help, then fine to continue to use it prn but advised may need to look closer at the maintenance regimen being used to achieve better control of airways disease with exertion.   Emphasized:  maint off cigs if at all possible  F/u with pfts in 3 m, call sooner if needed           Each maintenance medication was reviewed in detail including emphasizing most importantly the difference between maintenance and prns and under what circumstances the prns are to be triggered using an action plan format where appropriate.  Total time for H and P, chart review, counseling, reviewing hfa/dpi/neb  device(s) and generating customized AVS unique to this office visit / same day charting  > 30 min  new pt eval.

## 2022-02-13 LAB — ALPHA-1-ANTITRYPSIN PHENOTYP: A-1 Antitrypsin: 177 mg/dL (ref 101–187)

## 2022-03-04 DIAGNOSIS — Z1211 Encounter for screening for malignant neoplasm of colon: Secondary | ICD-10-CM | POA: Diagnosis not present

## 2022-03-06 ENCOUNTER — Ambulatory Visit (HOSPITAL_COMMUNITY)
Admission: RE | Admit: 2022-03-06 | Discharge: 2022-03-06 | Disposition: A | Discharge status: Home / Self Care | Payer: BC Managed Care – PPO | Source: Ambulatory Visit | Attending: Internal Medicine | Admitting: Internal Medicine

## 2022-03-06 DIAGNOSIS — J449 Chronic obstructive pulmonary disease, unspecified: Secondary | ICD-10-CM | POA: Diagnosis not present

## 2022-03-06 DIAGNOSIS — Z87891 Personal history of nicotine dependence: Secondary | ICD-10-CM | POA: Diagnosis not present

## 2022-03-07 ENCOUNTER — Encounter: Payer: Self-pay | Admitting: Internal Medicine

## 2022-03-07 DIAGNOSIS — R918 Other nonspecific abnormal finding of lung field: Secondary | ICD-10-CM | POA: Insufficient documentation

## 2022-03-11 ENCOUNTER — Other Ambulatory Visit: Payer: Self-pay | Admitting: Family Medicine

## 2022-03-11 DIAGNOSIS — J441 Chronic obstructive pulmonary disease with (acute) exacerbation: Secondary | ICD-10-CM

## 2022-03-13 LAB — COLOGUARD: COLOGUARD: NEGATIVE

## 2022-03-13 NOTE — Progress Notes (Signed)
Please inform the patient that his cologurad is negative for colon cancer.

## 2022-03-26 ENCOUNTER — Ambulatory Visit (HOSPITAL_COMMUNITY)
Admission: RE | Admit: 2022-03-26 | Discharge: 2022-03-26 | Disposition: A | Discharge status: Home / Self Care | Payer: BC Managed Care – PPO | Source: Ambulatory Visit | Attending: Internal Medicine | Admitting: Internal Medicine

## 2022-03-26 ENCOUNTER — Encounter (HOSPITAL_COMMUNITY): Payer: BC Managed Care – PPO

## 2022-03-26 DIAGNOSIS — J449 Chronic obstructive pulmonary disease, unspecified: Secondary | ICD-10-CM | POA: Insufficient documentation

## 2022-03-26 LAB — PULMONARY FUNCTION TEST
DL/VA % pred: 34 %
DL/VA: 1.48 ml/min/mmHg/L
DLCO unc % pred: 32 %
DLCO unc: 9 ml/min/mmHg
FEF 25-75 Post: 0.52 L/sec
FEF 25-75 Pre: 0.47 L/sec
FEF2575-%Change-Post: 10 %
FEF2575-%Pred-Post: 16 %
FEF2575-%Pred-Pre: 14 %
FEV1-%Change-Post: 10 %
FEV1-%Pred-Post: 36 %
FEV1-%Pred-Pre: 32 %
FEV1-Post: 1.32 L
FEV1-Pre: 1.19 L
FEV1FVC-%Change-Post: 6 %
FEV1FVC-%Pred-Pre: 46 %
FEV6-%Change-Post: 5 %
FEV6-%Pred-Post: 69 %
FEV6-%Pred-Pre: 65 %
FEV6-Post: 3.16 L
FEV6-Pre: 3 L
FEV6FVC-%Change-Post: 1 %
FEV6FVC-%Pred-Post: 95 %
FEV6FVC-%Pred-Pre: 93 %
FVC-%Change-Post: 3 %
FVC-%Pred-Post: 72 %
FVC-%Pred-Pre: 70 %
FVC-Post: 3.47 L
FVC-Pre: 3.34 L
Post FEV1/FVC ratio: 38 %
Post FEV6/FVC ratio: 91 %
Pre FEV1/FVC ratio: 36 %
Pre FEV6/FVC Ratio: 90 %
RV % pred: 298 %
RV: 6.23 L
TLC % pred: 152 %
TLC: 10.36 L

## 2022-03-26 MED ORDER — ALBUTEROL SULFATE (2.5 MG/3ML) 0.083% IN NEBU
2.5000 mg | INHALATION_SOLUTION | Freq: Once | RESPIRATORY_TRACT | Status: AC
  Administered 2022-03-26: 2.5 mg via RESPIRATORY_TRACT

## 2022-03-27 ENCOUNTER — Ambulatory Visit (INDEPENDENT_AMBULATORY_CARE_PROVIDER_SITE_OTHER): Payer: BC Managed Care – PPO | Admitting: Family Medicine

## 2022-03-27 ENCOUNTER — Encounter: Payer: Self-pay | Admitting: Family Medicine

## 2022-03-27 VITALS — BP 109/64 | HR 70 | Ht 69.0 in | Wt 121.1 lb

## 2022-03-27 DIAGNOSIS — Z720 Tobacco use: Secondary | ICD-10-CM

## 2022-03-27 DIAGNOSIS — E559 Vitamin D deficiency, unspecified: Secondary | ICD-10-CM

## 2022-03-27 DIAGNOSIS — J449 Chronic obstructive pulmonary disease, unspecified: Secondary | ICD-10-CM | POA: Diagnosis not present

## 2022-03-27 DIAGNOSIS — J441 Chronic obstructive pulmonary disease with (acute) exacerbation: Secondary | ICD-10-CM

## 2022-03-27 DIAGNOSIS — R7301 Impaired fasting glucose: Secondary | ICD-10-CM | POA: Diagnosis not present

## 2022-03-27 MED ORDER — ALBUTEROL SULFATE HFA 108 (90 BASE) MCG/ACT IN AERS: INHALATION_SPRAY | RESPIRATORY_TRACT | 2 refills | Status: DC

## 2022-03-27 NOTE — Assessment & Plan Note (Signed)
Reports cutting back on his smoking to 10 cigarettes daily Congratulated patient on cutting back and encouraged to keep the good work

## 2022-03-27 NOTE — Assessment & Plan Note (Addendum)
Following up with pulmonary and was started on Trelegy He reports compliance with his daily inhaler, with no respiratory complaints voiced today Refilled Albuterol inhaler

## 2022-03-27 NOTE — Patient Instructions (Signed)
I appreciate the opportunity to provide care to you today!    Follow up:  3 months CPE  Labs: please stop by the lab tomorrow to get your blood drawn (CBC, CMP, TSH, Lipid profile, HgA1c, Vit D)  Please pick up your inhaler at the pharmacy   Please continue to a heart-healthy diet and increase your physical activities. Try to exercise for at least three times a week.      It was a pleasure to see you and I look forward to continuing to work together on your health and well-being. Please do not hesitate to call the office if you need care or have questions about your care.   Have a wonderful day and week. With Gratitude, Gilmore Laroche MSN, FNP-BC

## 2022-03-27 NOTE — Progress Notes (Signed)
Established Patient Office Visit  Subjective:  Patient ID: Jerry Schaefer, male    DOB: [birth year]  Age: 56 y.o. MRN: [id number]  CC:  Chief Complaint  Patient presents with   Follow-up    3 month f/u, pt states he is cutting back on his smoking he is down to smoking 10 cig a day,     HPI Jerry Schaefer is a 56 y.o. male with past medical history of COPD presents for f/u of  chronic medical conditions.  COPD: following up with pulmonary and was started on Trelegy. He reports compliance with his daily inhaler, with no respiratory complaints voiced today.   Tobacco use: reports cutting back on his smoking to 10 cigarettes daily.   History reviewed. No pertinent past medical history.  History reviewed. No pertinent surgical history.  History reviewed. No pertinent family history.  Social History   Socioeconomic History   Marital status: Married    Spouse name: Not on file   Number of children: Not on file   Years of education: Not on file   Highest education level: Not on file  Occupational History   Not on file  Tobacco Use   Smoking status: Every Day    Packs/day: 0.50    Types: Cigarettes   Smokeless tobacco: Never   Tobacco comments:    Smoking 10 cigarettes a day.   Substance and Sexual Activity   Alcohol use: No   Drug use: No   Sexual activity: Not on file  Other Topics Concern   Not on file  Social History Narrative   Not on file   Social Determinants of Health   Financial Resource Strain: Not on file  Food Insecurity: Not on file  Transportation Needs: Not on file  Physical Activity: Not on file  Stress: Not on file  Social Connections: Not on file  Intimate Partner Violence: Not on file    Outpatient Medications Prior to Visit  Medication Sig Dispense Refill   Fluticasone-Umeclidin-Vilant (TRELEGY ELLIPTA) 100-62.5-25 MCG/ACT AEPB Inhale 1 puff into the lungs daily. 60 each 11   albuterol (VENTOLIN HFA) 108 (90 Base) MCG/ACT inhaler INHALE 1-2  PUFFS INTO THE LUNGS EVERY 6 HOURS AS NEEDED FOR WHEEZING OR SHORTNESS OF BREATH 8.5 g 2   naproxen sodium (ANAPROX) 220 MG tablet Take 440 mg by mouth every 4 (four) hours as needed.     Aspirin-Acetaminophen-Caffeine (GOODY HEADACHE PO) Take 2 packets by mouth 4 (four) times daily as needed (pain). (Patient not taking: Reported on 03/27/2022)     No facility-administered medications prior to visit.    No Known Allergies  ROS Review of Systems  Constitutional:  Negative for fatigue and fever.  Respiratory:  Negative for cough, chest tightness and shortness of breath.   Cardiovascular:  Negative for chest pain and palpitations.  Psychiatric/Behavioral:  Negative for self-injury and suicidal ideas.       Objective:    Physical Exam HENT:     Head: Normocephalic.  Cardiovascular:     Rate and Rhythm: Normal rate and regular rhythm.     Pulses: Normal pulses.     Heart sounds: Normal heart sounds.  Pulmonary:     Effort: Pulmonary effort is normal.     Breath sounds: Normal breath sounds.  Neurological:     Mental Status: He is alert.     BP 109/64   Pulse 70   Ht _0  (1.753 m)   Wt 121 lb 1.9 oz (54.9 kg)  SpO2 (!) 89%   BMI 17.89 kg/m  Wt Readings from Last 3 Encounters:  03/27/22 121 lb 1.9 oz (54.9 kg)  02/11/22 119 lb 6.4 oz (54.2 kg)  12/25/21 117 lb 3.2 oz (53.2 kg)    Lab Results  Component Value Date   TSH 0.744 12/25/2021   Lab Results  Component Value Date   WBC 8.5 12/25/2021   HGB 15.5 12/25/2021   HCT 45.1 12/25/2021   MCV 94 12/25/2021   PLT 250 12/25/2021   Lab Results  Component Value Date   NA 138 12/25/2021   K 5.1 12/25/2021   CO2 25 12/25/2021   GLUCOSE 111 (H) 12/25/2021   BUN 11 12/25/2021   CREATININE 0.86 12/25/2021   BILITOT 0.2 12/25/2021   ALKPHOS 68 12/25/2021   AST 14 12/25/2021   ALT 10 12/25/2021   PROT 6.7 12/25/2021   ALBUMIN 4.2 12/25/2021   CALCIUM 9.4 12/25/2021   ANIONGAP 9 12/27/2016   EGFR 103  12/25/2021   Lab Results  Component Value Date   CHOL 115 12/25/2021   Lab Results  Component Value Date   HDL 43 12/25/2021   Lab Results  Component Value Date   LDLCALC 57 12/25/2021   Lab Results  Component Value Date   TRIG 71 12/25/2021   Lab Results  Component Value Date   CHOLHDL 2.7 12/25/2021   Lab Results  Component Value Date   HGBA1C 6.4 (H) 12/25/2021      Assessment & Plan:   Problem List Items Addressed This Visit       Respiratory   COPD with acute exacerbation (HCC)   Relevant Medications   albuterol (VENTOLIN HFA) 108 (90 Base) MCG/ACT inhaler   COPD GOLD ? - Primary    Following up with pulmonary and was started on Trelegy He reports compliance with his daily inhaler, with no respiratory complaints voiced today Refilled Albuterol inhaler      Relevant Medications   albuterol (VENTOLIN HFA) 108 (90 Base) MCG/ACT inhaler     Other   Tobacco use    Reports cutting back on his smoking to 10 cigarettes daily Congratulated patient on cutting back and encouraged to keep the good work       Other Visit Diagnoses     Vitamin D deficiency       Relevant Orders   Vitamin D (25 hydroxy)   IFG (impaired fasting glucose)       Relevant Orders   CBC with Differential/Platelet   CMP14+EGFR   TSH + free T4   Lipid Profile   Hemoglobin A1C       Meds ordered this encounter  Medications   albuterol (VENTOLIN HFA) 108 (90 Base) MCG/ACT inhaler    Sig: INHALE 1-2 PUFFS INTO THE LUNGS EVERY 4-6 HOURS AS NEEDED FOR WHEEZING OR SHORTNESS OF BREATH    Dispense:  8.5 g    Refill:  2    This prescription was filled on 02/24/2022. Any refills authorized will be placed on file.    Follow-up: Return in about 3 months (around 06/27/2022) for CPE.    Alvira Monday, FNP

## 2022-03-27 NOTE — Progress Notes (Signed)
llow

## 2022-03-28 DIAGNOSIS — E559 Vitamin D deficiency, unspecified: Secondary | ICD-10-CM | POA: Diagnosis not present

## 2022-03-28 DIAGNOSIS — R7301 Impaired fasting glucose: Secondary | ICD-10-CM | POA: Diagnosis not present

## 2022-03-29 LAB — HEMOGLOBIN A1C
Est. average glucose Bld gHb Est-mCnc: 126 mg/dL
Hgb A1c MFr Bld: 6 % — ABNORMAL HIGH (ref 4.8–5.6)

## 2022-03-29 LAB — CMP14+EGFR
ALT: 14 IU/L (ref 0–44)
AST: 15 IU/L (ref 0–40)
Albumin/Globulin Ratio: 1.8 (ref 1.2–2.2)
Albumin: 4.2 g/dL (ref 3.8–4.9)
Alkaline Phosphatase: 60 IU/L (ref 44–121)
BUN/Creatinine Ratio: 10 (ref 9–20)
BUN: 9 mg/dL (ref 6–24)
Bilirubin Total: 0.3 mg/dL (ref 0.0–1.2)
CO2: 25 mmol/L (ref 20–29)
Calcium: 9.2 mg/dL (ref 8.7–10.2)
Chloride: 104 mmol/L (ref 96–106)
Creatinine, Ser: 0.93 mg/dL (ref 0.76–1.27)
Globulin, Total: 2.3 g/dL (ref 1.5–4.5)
Glucose: 105 mg/dL — ABNORMAL HIGH (ref 70–99)
Potassium: 4 mmol/L (ref 3.5–5.2)
Sodium: 142 mmol/L (ref 134–144)
Total Protein: 6.5 g/dL (ref 6.0–8.5)
eGFR: 97 mL/min/{1.73_m2} (ref >59)

## 2022-03-29 LAB — VITAMIN D 25 HYDROXY (VIT D DEFICIENCY, FRACTURES): Vit D, 25-Hydroxy: 23.8 ng/mL — ABNORMAL LOW (ref 30.0–100.0)

## 2022-03-29 LAB — CBC WITH DIFFERENTIAL/PLATELET
Basophils Absolute: 0.1 10*3/uL (ref 0.0–0.2)
Basos: 1 %
EOS (ABSOLUTE): 0.3 10*3/uL (ref 0.0–0.4)
Eos: 3 %
Hematocrit: 40 % (ref 37.5–51.0)
Hemoglobin: 14.1 g/dL (ref 13.0–17.7)
Immature Grans (Abs): 0 10*3/uL (ref 0.0–0.1)
Immature Granulocytes: 0 %
Lymphocytes Absolute: 2.1 10*3/uL (ref 0.7–3.1)
Lymphs: 24 %
MCH: 32.9 pg (ref 26.6–33.0)
MCHC: 35.3 g/dL (ref 31.5–35.7)
MCV: 94 fL (ref 79–97)
Monocytes Absolute: 1.1 10*3/uL — ABNORMAL HIGH (ref 0.1–0.9)
Monocytes: 13 %
Neutrophils Absolute: 5.2 10*3/uL (ref 1.4–7.0)
Neutrophils: 59 %
Platelets: 246 10*3/uL (ref 150–450)
RBC: 4.28 x10E6/uL (ref 4.14–5.80)
RDW: 12.2 % (ref 11.6–15.4)
WBC: 8.7 10*3/uL (ref 3.4–10.8)

## 2022-03-29 LAB — LIPID PANEL
Chol/HDL Ratio: 2.5 ratio (ref 0.0–5.0)
Cholesterol, Total: 130 mg/dL (ref 100–199)
HDL: 52 mg/dL (ref >39)
LDL Chol Calc (NIH): 66 mg/dL (ref 0–99)
Triglycerides: 54 mg/dL (ref 0–149)
VLDL Cholesterol Cal: 12 mg/dL (ref 5–40)

## 2022-03-29 LAB — TSH+FREE T4
Free T4: 1.75 ng/dL (ref 0.82–1.77)
TSH: 0.638 u[IU]/mL (ref 0.450–4.500)

## 2022-04-02 ENCOUNTER — Other Ambulatory Visit: Payer: Self-pay | Admitting: Family Medicine

## 2022-04-02 DIAGNOSIS — E559 Vitamin D deficiency, unspecified: Secondary | ICD-10-CM

## 2022-04-02 MED ORDER — VITAMIN D (ERGOCALCIFEROL) 1.25 MG (50000 UNIT) PO CAPS: 50000.0000 [IU] | ORAL_CAPSULE | ORAL | 2 refills | Status: DC

## 2022-04-02 NOTE — Progress Notes (Signed)
Please inform the patient that he has hga1c has decreased from 6.4 to 6.0. He is prediabetic.  I recommend that he reduce his carbs and intake of food high in sugar.  I've sent a prescription for vitamin D supplement to his pharmacy to start taking. His Vit D is slightly low.

## 2022-05-20 ENCOUNTER — Encounter: Payer: Self-pay | Admitting: Internal Medicine

## 2022-05-20 ENCOUNTER — Ambulatory Visit (INDEPENDENT_AMBULATORY_CARE_PROVIDER_SITE_OTHER): Payer: BC Managed Care – PPO | Admitting: Internal Medicine

## 2022-05-20 VITALS — BP 118/62 | HR 86 | Temp 98.2°F | Ht 69.0 in | Wt 118.8 lb

## 2022-05-20 DIAGNOSIS — J449 Chronic obstructive pulmonary disease, unspecified: Secondary | ICD-10-CM | POA: Diagnosis not present

## 2022-05-20 DIAGNOSIS — F17210 Nicotine dependence, cigarettes, uncomplicated: Secondary | ICD-10-CM | POA: Diagnosis not present

## 2022-05-20 DIAGNOSIS — R918 Other nonspecific abnormal finding of lung field: Secondary | ICD-10-CM | POA: Diagnosis not present

## 2022-05-20 DIAGNOSIS — Z23 Encounter for immunization: Secondary | ICD-10-CM

## 2022-05-20 MED ORDER — BREZTRI AEROSPHERE 160-9-4.8 MCG/ACT IN AERO: INHALATION_SPRAY | RESPIRATORY_TRACT | 11 refills | Status: DC

## 2022-05-20 MED ORDER — BREZTRI AEROSPHERE 160-9-4.8 MCG/ACT IN AERO: 2.0000 | INHALATION_SPRAY | Freq: Two times a day (BID) | RESPIRATORY_TRACT | 0 refills | Status: DC

## 2022-05-20 NOTE — Assessment & Plan Note (Addendum)
Counseled re importance of smoking cessation but did not meet time criteria for separate billing            Each maintenance medication was reviewed in detail including emphasizing most importantly the difference between maintenance and prns and under what circumstances the prns are to be triggered using an action plan format where appropriate.  Total time for H and P, chart review, counseling, reviewing hfa device(s) and generating customized AVS unique to this office visit / same day charting  > 30 min       

## 2022-05-20 NOTE — Patient Instructions (Signed)
Plan A = Automatic = Always=  stop trelegyy   Breztri Take 2 puffs first thing in am and then another 2 puffs about 12 hours later.    Work on inhaler technique:  relax and gently blow all the way out then take a nice smooth full deep breath back in, triggering the inhaler at same time you start breathing in.  Hold breath in for at least  5 seconds if you can. Blow out [employer]  thru nose. Rinse and gargle with water when done.  If mouth or throat bother you at all,  try brushing teeth/gums/tongue with arm and hammer toothpaste/ make a slurry and gargle and spit out.  - use breztri empty empty cannister to practice   Plan B = Backup (to supplement plan A, not to replace it) Only use your albuterol inhaler as a rescue medication to be used if you can't catch your breath by resting or doing a relaxed purse lip breathing pattern.  - The less you use it, the better it will work when you need it. - Ok to use the inhaler up to 2 puffs  every 4 hours if you must but call for appointment if use goes up over your usual need - Don't leave home without it !!  (think of it like the spare tire for your car)   Plan C = Crisis (instead of Plan B but only if Plan B stops working) - only use your albuterol nebulizer if you first try Plan B and it fails to help > ok to use the nebulizer up to every 4 hours but if start needing it regularly call for immediate appointment  Also  Ok to try albuterol 15 min before an activity (on alternating days)  that you know would usually make you short of breath and see if it makes any difference and if makes none then don't take albuterol after activity unless you can't catch your breath as this means it's the resting that helps, not the albuterol.  Please schedule a follow up visit in 3 months but call sooner if needed

## 2022-05-20 NOTE — Assessment & Plan Note (Signed)
LDSCT chest  03/06/22  1. Lung-RADS 3, probably benign findings. Short-term follow-up in 6 months is recommended with repeat low-dose chest CT without contrast (please use the following order, "CT CHEST LCS NODULE FOLLOW-UP W/O CM"). Multiple pulmonary nodules of maximally volume derived equivalent diameter 7.4 mm>> placed in reminder file for 09/06/22 repeat CT   Reviewed with pt, likely benign, f/u as planned

## 2022-05-20 NOTE — Progress Notes (Signed)
Jerry Schaefer, male    DOB: [birth year]    MRN: [id number]   Brief patient profile:  56  yowm truck driver  Active smoker/MM  referred to pulmonary clinic in [place]  02/11/2022 by Jerry Schaefer for copd eval / had seen Jerry Schaefer  rx [name] seemed to help in past, saba dep at 1st pulmonary eval    History of Present Illness  02/11/2022  Pulmonary/ 1st Schaefer eval/ Jerry Schaefer / Jerry Schaefer Schaefer no maint rx and last aecopd was 12/25/21  Chief Complaint  Patient presents with   Consult    Used to see Jerry. [name] for COPD   Dyspnea:  walking at [employer] is a struggle  Cough: better p aecopd rx with doxy Sleep: level bed/ 2 pillows  SABA use: 2 puffs x 3 per day and neb up to bid  Rec Plan A = Automatic = Always=    Trelegy 100 one click take 2 good drags each am then rinse and gargle Plan B = Backup (to supplement plan A, not to replace it) Only use your albuterol inhaler as a rescue medication Plan C = Crisis (instead of Plan B but only if Plan B stops working) - only use your albuterol nebulizer if you first try Plan B and it fails to help  Ok to try albuterol 15 min before an activity (on alternating days)  that you know would usually make you short of breath  -  Allergy screen  12/25/21  >  Eos 0.4 - Labs ordered 02/11/2022  :    alpha one AT phenotype  MM level 177  - PFT's  03/26/22  FEV1 1.32  (36 % ) ratio 0.38  p 10 % improvement from saba p Trelegy prior to study with DLCO  9 (32%)  FV curve classically concave  - LDCT 03/06/22 RADS 2 MPN all < 7.2 mm    05/20/2022  f/u ov/Jerry Schaefer/Jerry Schaefer re: GOLD  3 still smoking maint on trelegy  100 worse hoarseness but less saba need, better ex tol  Chief Complaint  Patient presents with   Follow-up    Breathing doing a little better  CT done 03/06/22  Dyspnea:  [employer] walking ok now / pushing mowing  Cough: mucus slt beige, worse in am Sleeping: level bed with 2 pillows  SABA use: 4 x daily and at hs  02: none  Covid status: none  Lung  cancer screening: in program  No obvious day to day or daytime variability or assoc excess/ purulent sputum or mucus plugs or hemoptysis or cp or chest tightness, subjective wheeze or overt sinus or hb symptoms.   Sleeps ok  without nocturnal  or early am exacerbation  of respiratory  c/o's or need for noct saba. Also denies any obvious fluctuation of symptoms with weather or environmental changes or other aggravating or alleviating factors except as outlined above   No unusual exposure hx or h/o childhood pna/ asthma or knowledge of premature birth.  Current Allergies, Complete Past Medical History, Past Surgical History, Family History, and Social History were reviewed in [name].  ROS  The following are not active complaints unless bolded Hoarseness, sore throat, dysphagia, dental problems, itching, sneezing,  nasal congestion or discharge of excess mucus or purulent secretions, ear ache,   fever, chills, sweats, unintended wt loss or wt gain, classically pleuritic or exertional cp,  orthopnea pnd or arm/hand swelling  or leg swelling, presyncope, palpitations, abdominal pain, anorexia, nausea, vomiting,  diarrhea  or change in bowel habits or change in bladder habits, change in stools or change in urine, dysuria, hematuria,  rash, arthralgias, visual complaints, headache, numbness, weakness or ataxia or problems with walking or coordination,  change in mood or  memory.        Current Meds  Medication Sig   albuterol (VENTOLIN HFA) 108 (90 Base) MCG/ACT inhaler INHALE 1-2 PUFFS INTO THE LUNGS EVERY 4-6 HOURS AS NEEDED FOR WHEEZING OR SHORTNESS OF BREATH   Fluticasone-Umeclidin-Vilant (TRELEGY ELLIPTA) 100-62.5-25 MCG/ACT AEPB Inhale 1 puff into the lungs daily.   Vitamin D, Ergocalciferol, (DRISDOL) 1.25 MG (50000 UNIT) CAPS capsule Take 1 capsule (50,000 Units total) by mouth every 7 (seven) days.            Objective:    Wt Readings from Last 3  Encounters:  05/20/22 118 lb 12.8 oz (53.9 kg)  03/27/22 121 lb 1.9 oz (54.9 kg)  02/11/22 119 lb 6.4 oz (54.2 kg)      Vital signs reviewed  05/20/2022  - Note at rest 02 sats 94% on RA   General appearance:    hoarse am nad      HEENT :  Oropharynx  clear  Nasal turbinates nl    NECK :  without JVD/Nodes/TM/ nl carotid upstrokes bilaterally   LUNGS: no acc muscle use,  Mod barrel  contour chest wall with bilateral  Distant bs s audible wheeze and  without cough on insp or exp maneuvers and mod  Hyperresonant  to  percussion bilaterally     CV:  RRR  no s3 or murmur or increase in P2, and no edema   ABD:  soft and nontender with pos mid insp Hoover's  in the supine position. No bruits or organomegaly appreciated, bowel sounds nl  MS:   Ext warm without deformities or   obvious joint restrictions , calf tenderness, cyanosis or clubbing  SKIN: warm and dry without lesions    NEURO:  alert, approp, nl sensorium with  no motor or cerebellar deficits apparent.           Assessment

## 2022-05-20 NOTE — Assessment & Plan Note (Signed)
?   Quit smoking 12/2021  -  Allergy screen  12/25/21  >  Eos 0.4 - Labs ordered 02/11/2022  :    alpha one AT phenotype  MM level 177  - 02/11/2022    try trelegy 440 one click each am  - PFT's  03/26/22  FEV1 1.32  (36 % ) ratio 0.38  p 10 % improvement from saba p Trelegy prior to study with DLCO  9 (32%)  FV curve classically concave  - 05/20/2022  After extensive coaching inhaler device,  effectiveness =    90% with hfa > changed to breztri due to hoarseness on terelegy 100   Group D (now reclassified as E) in terms of symptom/risk and laba/lama/ICS  therefore appropriate rx at this point >>>  breztri 2bid and approp saba  Re SABA :  I spent extra time with pt today reviewing appropriate use of albuterol for prn use on exertion with the following points: 1) saba is for relief of sob that does not improve by walking a slower pace or resting but rather if the pt does not improve after trying this first. 2) If the pt is convinced, as many are, that saba helps recover from activity faster then it's easy to tell if this is the case by re-challenging : ie stop, take the inhaler, then p 5 minutes try the exact same activity (intensity of workload) that just caused the symptoms and see if they are substantially diminished or not after saba 3) if there is an activity that reproducibly causes the symptoms, try the saba 15 min before the activity on alternate days   If in fact the saba really does help, then fine to continue to use it prn but advised may need to look closer at the maintenance regimen being used to achieve better control of airways disease with exertion.

## 2022-06-26 ENCOUNTER — Encounter: Payer: Self-pay | Admitting: Family Medicine

## 2022-06-26 ENCOUNTER — Ambulatory Visit (INDEPENDENT_AMBULATORY_CARE_PROVIDER_SITE_OTHER): Payer: BC Managed Care – PPO | Admitting: Family Medicine

## 2022-06-26 VITALS — BP 109/73 | HR 97 | Ht 69.0 in | Wt 120.1 lb

## 2022-06-26 DIAGNOSIS — F17210 Nicotine dependence, cigarettes, uncomplicated: Secondary | ICD-10-CM | POA: Diagnosis not present

## 2022-06-26 DIAGNOSIS — R399 Unspecified symptoms and signs involving the genitourinary system: Secondary | ICD-10-CM

## 2022-06-26 DIAGNOSIS — E7849 Other hyperlipidemia: Secondary | ICD-10-CM

## 2022-06-26 DIAGNOSIS — Z0001 Encounter for general adult medical examination with abnormal findings: Secondary | ICD-10-CM | POA: Diagnosis not present

## 2022-06-26 DIAGNOSIS — E038 Other specified hypothyroidism: Secondary | ICD-10-CM

## 2022-06-26 DIAGNOSIS — E559 Vitamin D deficiency, unspecified: Secondary | ICD-10-CM

## 2022-06-26 DIAGNOSIS — Z125 Encounter for screening for malignant neoplasm of prostate: Secondary | ICD-10-CM

## 2022-06-26 DIAGNOSIS — R7301 Impaired fasting glucose: Secondary | ICD-10-CM

## 2022-06-26 MED ORDER — TAMSULOSIN HCL 0.4 MG PO CAPS: 0.4000 mg | ORAL_CAPSULE | Freq: Every day | ORAL | 3 refills | Status: DC

## 2022-06-26 MED ORDER — VITAMIN D (ERGOCALCIFEROL) 1.25 MG (50000 UNIT) PO CAPS: 50000.0000 [IU] | ORAL_CAPSULE | ORAL | 3 refills | Status: DC

## 2022-06-26 NOTE — Assessment & Plan Note (Signed)
Smokes about 1 pack/day  Asked about quitting: confirms that he currently smokes cigarettes Advise to quit smoking: Educated about QUITTING to reduce the risk of cancer, cardio and cerebrovascular disease. Assess willingness: Unwilling to quit at this time, but is working on cutting back. Assist with counseling and pharmacotherapy: Counseled for 5 minutes and literature provided. Arrange for follow up: follow up in 3 months and continue to offer help.  

## 2022-06-26 NOTE — Assessment & Plan Note (Signed)
Physical exam as documented Counseling is done on healthy lifestyle involving commitment to 150 minutes of exercise per week,  Discussed heart-healthy diet     

## 2022-06-26 NOTE — Assessment & Plan Note (Signed)
Will assess PSA levels today and start patient on tamsulosin 0.4 mg daily

## 2022-06-26 NOTE — Patient Instructions (Signed)
I appreciate the opportunity to provide care to you today!    Follow up:  3 months  Labs: please stop by the lab  during the week to get your blood drawn (CBC, CMP, TSH, Lipid profile, HgA1c, Vit D)   Please pick up your medications at the pharmacy   Please continue to a heart-healthy diet and increase your physical activities. Try to exercise for 30mins at least three times a week.      It was a pleasure to see you and I look forward to continuing to work together on your health and well-being. Please do not hesitate to call the office if you need care or have questions about your care.   Have a wonderful day and week. With Gratitude, Gay Moncivais MSN, FNP-BC  

## 2022-06-26 NOTE — Progress Notes (Signed)
Complete physical exam  Patient: Jerry Schaefer   DOB: Nov 30, 1965   56 y.o. Male  MRN: [id number]  Subjective:    Chief Complaint  Patient presents with   Annual Exam    Pt here for cpe today, states he is doing better with breathing. Pt reports problems urinating, feels like he can't empty his bladder all the way.     Jerry Schaefer is a 56 y.o. male who presents today for a complete physical exam. He reports consuming a general diet. Home exercise routine includes walking daily. He generally feels well. He reports sleeping poorly.  He shares that his wife lost her second child on 04/28/2022 and has been worry about her. He does not have additional problems to discuss today.   LUTS: He reports family history of prostate cancer and complains of excessive urination at night, frequent urination, a sense of incomplete bladder emptying, and a weak urinary stream.  He reports symptoms have been ongoing for about 6 months.  Most recent fall risk assessment:    06/26/2022    9:02 AM  Fall Risk   Falls in the past year? 0  Number falls in past yr: 0  Injury with Fall? 0  Risk for fall due to : No Fall Risks  Follow up Falls evaluation completed     Most recent depression screenings:    06/26/2022    9:02 AM 03/27/2022    8:09 AM  PHQ 2/9 Scores  PHQ - 2 Score 0 0  PHQ- 9 Score 3     Dental: No current dental problems and No regular dental care   Patient Active Problem List   Diagnosis Date Noted   Encounter for general adult medical examination with abnormal findings 06/26/2022   Lower urinary tract symptoms (LUTS) 06/26/2022   Multiple pulmonary nodules determined by computed tomography of lung 03/07/2022   COPD GOLD 3 02/11/2022   COPD with acute exacerbation ([place]) 12/25/2021   Cigarette smoker 12/25/2021   History reviewed. No pertinent past medical history. History reviewed. No pertinent surgical history. Social History   Tobacco Use   Smoking status: Every Day     Packs/day: 1.00    Types: Cigarettes   Smokeless tobacco: Never   Tobacco comments:    Smoking 20 cigarettes a day.   Substance Use Topics   Alcohol use: No   Drug use: No   Social History   Socioeconomic History   Marital status: Married    Spouse name: Not on file   Number of children: Not on file   Years of education: Not on file   Highest education level: Not on file  Occupational History   Not on file  Tobacco Use   Smoking status: Every Day    Packs/day: 1.00    Types: Cigarettes   Smokeless tobacco: Never   Tobacco comments:    Smoking 20 cigarettes a day.   Substance and Sexual Activity   Alcohol use: No   Drug use: No   Sexual activity: Not on file  Other Topics Concern   Not on file  Social History Narrative   Not on file   Social Determinants of Health   Financial Resource Strain: Not on file  Food Insecurity: Not on file  Transportation Needs: Not on file  Physical Activity: Not on file  Stress: Not on file  Social Connections: Not on file  Intimate Partner Violence: Not on file   No family status information on file.  History reviewed. No pertinent family history. No Known Allergies    Patient Care Team: Alvira Monday, FNP as PCP - General (Family Medicine)   Outpatient Medications Prior to Visit  Medication Sig   albuterol (VENTOLIN HFA) 108 (90 Base) MCG/ACT inhaler INHALE 1-2 PUFFS INTO THE LUNGS EVERY 4-6 HOURS AS NEEDED FOR WHEEZING OR SHORTNESS OF BREATH   Budeson-Glycopyrrol-Formoterol (BREZTRI AEROSPHERE) 160-9-4.8 MCG/ACT AERO Take 2 puffs first thing in am and then another 2 puffs about 12 hours later.   Fluticasone-Umeclidin-Vilant (TRELEGY ELLIPTA) 200-62.5-25 MCG/ACT AEPB Inhale 1 puff into the lungs daily.   [DISCONTINUED] Vitamin D, Ergocalciferol, (DRISDOL) 1.25 MG (50000 UNIT) CAPS capsule Take 1 capsule (50,000 Units total) by mouth every 7 (seven) days.   No facility-administered medications prior to visit.    Review  of Systems  Constitutional:  Negative for fever and malaise/fatigue.  Eyes:  Negative for pain, discharge and redness.  Respiratory:  Negative for cough and shortness of breath.   Cardiovascular:  Negative for chest pain and orthopnea.  Gastrointestinal:  Negative for nausea and vomiting.  Genitourinary:  Positive for frequency. Negative for hematuria.  Musculoskeletal:  Negative for joint pain.  Skin:  Negative for itching.  Neurological:  Negative for dizziness and headaches.  Psychiatric/Behavioral:  Negative for memory loss and suicidal ideas.        Objective:    BP 109/73   Pulse 97   Ht _0  (1.753 m)   Wt 120 lb 1.9 oz (54.5 kg)   SpO2 95%   BMI 17.74 kg/m  BP Readings from Last 3 Encounters:  06/26/22 109/73  05/20/22 118/62  03/27/22 109/64   Wt Readings from Last 3 Encounters:  06/26/22 120 lb 1.9 oz (54.5 kg)  05/20/22 118 lb 12.8 oz (53.9 kg)  03/27/22 121 lb 1.9 oz (54.9 kg)      Physical Exam HENT:     Head: Normocephalic.     Right Ear: External ear normal.     Left Ear: External ear normal.     Nose: No congestion.     Mouth/Throat:     Mouth: Mucous membranes are moist.  Eyes:     Extraocular Movements: Extraocular movements intact.     Pupils: Pupils are equal, round, and reactive to light.  Cardiovascular:     Rate and Rhythm: Normal rate and regular rhythm.     Pulses: Normal pulses.     Heart sounds: Normal heart sounds.  Pulmonary:     Effort: Pulmonary effort is normal.     Breath sounds: Normal breath sounds.  Abdominal:     Tenderness: There is no right CVA tenderness or left CVA tenderness.  Musculoskeletal:     Cervical back: No rigidity.     Right lower leg: No edema.     Left lower leg: No edema.  Skin:    Findings: No lesion or rash.  Neurological:     Mental Status: He is alert and oriented to person, place, and time.     GCS: GCS eye subscore is 4. GCS motor subscore is 6.     Sensory: Sensation is intact.     Motor:  No pronator drift.     Coordination: Finger-Nose-Finger Test normal.     Gait: Gait normal.  Psychiatric:     Comments: Normal affect     No results found for any visits on 06/26/22. Last CBC Lab Results  Component Value Date   WBC 8.7 03/28/2022   HGB 14.1  03/28/2022   HCT 40.0 03/28/2022   MCV 94 03/28/2022   MCH 32.9 03/28/2022   RDW 12.2 03/28/2022   PLT 246 95/28/4132   Last metabolic panel Lab Results  Component Value Date   GLUCOSE 105 (H) 03/28/2022   NA 142 03/28/2022   K 4.0 03/28/2022   CL 104 03/28/2022   CO2 25 03/28/2022   BUN 9 03/28/2022   CREATININE 0.93 03/28/2022   EGFR 97 03/28/2022   CALCIUM 9.2 03/28/2022   PROT 6.5 03/28/2022   ALBUMIN 4.2 03/28/2022   LABGLOB 2.3 03/28/2022   AGRATIO 1.8 03/28/2022   BILITOT 0.3 03/28/2022   ALKPHOS 60 03/28/2022   AST 15 03/28/2022   ALT 14 03/28/2022   ANIONGAP 9 12/27/2016   Last lipids Lab Results  Component Value Date   CHOL 130 03/28/2022   HDL 52 03/28/2022   LDLCALC 66 03/28/2022   TRIG 54 03/28/2022   CHOLHDL 2.5 03/28/2022   Last hemoglobin A1c Lab Results  Component Value Date   HGBA1C 6.0 (H) 03/28/2022   Last thyroid functions Lab Results  Component Value Date   TSH 0.638 03/28/2022   Last vitamin D Lab Results  Component Value Date   VD25OH 23.8 (L) 03/28/2022   Last vitamin B12 and Folate No results found for: "VITAMINB12", "FOLATE"      Assessment & Plan:    Routine Health Maintenance and Physical Exam  Immunization History  Administered Date(s) Administered   Influenza,inj,Quad PF,6+ Mos 05/20/2022   Tdap 12/25/2021    Health Maintenance  Topic Date Due   COVID-19 Vaccine (1) Never done   Zoster Vaccines- Shingrix (1 of 2) 09/26/2022 (Originally 01/10/2017)   Fecal DNA (Cologuard)  03/04/2025   DTaP/Tdap/Td (2 - Td or Tdap) 12/26/2031   INFLUENZA VACCINE  Completed   Hepatitis C Screening  Completed   HIV Screening  Completed   HPV VACCINES  Aged Out     Discussed health benefits of physical activity, and encouraged him to engage in regular exercise appropriate for his age and condition.  Encounter for general adult medical examination with abnormal findings Assessment & Plan: Physical exam as documented Counseling is done on healthy lifestyle involving commitment to 150 minutes of exercise per week,  Discussed heart-healthy diet        Cigarette smoker Assessment & Plan: Smokes about 1 pack/day  Asked about quitting: confirms that he currently smokes cigarettes Advise to quit smoking: Educated about QUITTING to reduce the risk of cancer, cardio and cerebrovascular disease. Assess willingness: Unwilling to quit at this time, but is working on cutting back. Assist with counseling and pharmacotherapy: Counseled for 5 minutes and literature provided. Arrange for follow up: follow up in 3 months and continue to offer help.    Lower urinary tract symptoms (LUTS) Assessment & Plan: Will assess PSA levels today and start patient on tamsulosin 0.4 mg daily   Orders: -     Tamsulosin HCl; Take 1 capsule (0.4 mg total) by mouth daily.  Dispense: 30 capsule; Refill: 3  Prostate cancer screening -     PSA, total and free  Vitamin D deficiency -     VITAMIN D 25 Hydroxy (Vit-D Deficiency, Fractures) -     Vitamin D (Ergocalciferol); Take 1 capsule (50,000 Units total) by mouth every 7 (seven) days.  Dispense: 5 capsule; Refill: 3  IFG (impaired fasting glucose) -     Hemoglobin A1c  Other specified hypothyroidism -     TSH + free  T4  Other hyperlipidemia -     Lipid panel -     CMP14+EGFR -     CBC with Differential/Platelet    Return in about 3 months (around 09/26/2022).     Alvira Monday, FNP

## 2022-07-01 DIAGNOSIS — E7849 Other hyperlipidemia: Secondary | ICD-10-CM | POA: Diagnosis not present

## 2022-07-01 DIAGNOSIS — E038 Other specified hypothyroidism: Secondary | ICD-10-CM | POA: Diagnosis not present

## 2022-07-01 DIAGNOSIS — Z125 Encounter for screening for malignant neoplasm of prostate: Secondary | ICD-10-CM | POA: Diagnosis not present

## 2022-07-01 DIAGNOSIS — E559 Vitamin D deficiency, unspecified: Secondary | ICD-10-CM | POA: Diagnosis not present

## 2022-07-01 DIAGNOSIS — R7301 Impaired fasting glucose: Secondary | ICD-10-CM | POA: Diagnosis not present

## 2022-07-02 LAB — CBC WITH DIFFERENTIAL/PLATELET
Basophils Absolute: 0 10*3/uL (ref 0.0–0.2)
Basos: 0 %
EOS (ABSOLUTE): 0.6 10*3/uL — ABNORMAL HIGH (ref 0.0–0.4)
Eos: 7 %
Hematocrit: 40.5 % (ref 37.5–51.0)
Hemoglobin: 13.8 g/dL (ref 13.0–17.7)
Immature Grans (Abs): 0 10*3/uL (ref 0.0–0.1)
Immature Granulocytes: 0 %
Lymphocytes Absolute: 2.3 10*3/uL (ref 0.7–3.1)
Lymphs: 30 %
MCH: 32.6 pg (ref 26.6–33.0)
MCHC: 34.1 g/dL (ref 31.5–35.7)
MCV: 96 fL (ref 79–97)
Monocytes Absolute: 1.1 10*3/uL — ABNORMAL HIGH (ref 0.1–0.9)
Monocytes: 15 %
Neutrophils Absolute: 3.6 10*3/uL (ref 1.4–7.0)
Neutrophils: 48 %
Platelets: 274 10*3/uL (ref 150–450)
RBC: 4.23 x10E6/uL (ref 4.14–5.80)
RDW: 12.4 % (ref 11.6–15.4)
WBC: 7.6 10*3/uL (ref 3.4–10.8)

## 2022-07-02 LAB — PSA, TOTAL AND FREE
PSA, Free Pct: 28.6 %
PSA, Free: 0.4 ng/mL
Prostate Specific Ag, Serum: 1.4 ng/mL (ref 0.0–4.0)

## 2022-07-02 LAB — CMP14+EGFR
ALT: 9 IU/L (ref 0–44)
AST: 14 IU/L (ref 0–40)
Albumin/Globulin Ratio: 1.5 (ref 1.2–2.2)
Albumin: 4.1 g/dL (ref 3.8–4.9)
Alkaline Phosphatase: 68 IU/L (ref 44–121)
BUN/Creatinine Ratio: 13 (ref 9–20)
BUN: 10 mg/dL (ref 6–24)
Bilirubin Total: 0.3 mg/dL (ref 0.0–1.2)
CO2: 23 mmol/L (ref 20–29)
Calcium: 9.2 mg/dL (ref 8.7–10.2)
Chloride: 103 mmol/L (ref 96–106)
Creatinine, Ser: 0.78 mg/dL (ref 0.76–1.27)
Globulin, Total: 2.7 g/dL (ref 1.5–4.5)
Glucose: 114 mg/dL — ABNORMAL HIGH (ref 70–99)
Potassium: 3.9 mmol/L (ref 3.5–5.2)
Sodium: 140 mmol/L (ref 134–144)
Total Protein: 6.8 g/dL (ref 6.0–8.5)
eGFR: 105 mL/min/{1.73_m2} (ref >59)

## 2022-07-02 LAB — HEMOGLOBIN A1C
Est. average glucose Bld gHb Est-mCnc: 126 mg/dL
Hgb A1c MFr Bld: 6 % — ABNORMAL HIGH (ref 4.8–5.6)

## 2022-07-02 LAB — LIPID PANEL
Chol/HDL Ratio: 3.1 ratio (ref 0.0–5.0)
Cholesterol, Total: 144 mg/dL (ref 100–199)
HDL: 47 mg/dL (ref >39)
LDL Chol Calc (NIH): 85 mg/dL (ref 0–99)
Triglycerides: 55 mg/dL (ref 0–149)
VLDL Cholesterol Cal: 12 mg/dL (ref 5–40)

## 2022-07-02 LAB — TSH+FREE T4
Free T4: 1.6 ng/dL (ref 0.82–1.77)
TSH: 1.4 u[IU]/mL (ref 0.450–4.500)

## 2022-07-02 LAB — VITAMIN D 25 HYDROXY (VIT D DEFICIENCY, FRACTURES): Vit D, 25-Hydroxy: 49 ng/mL (ref 30.0–100.0)

## 2022-07-07 NOTE — Progress Notes (Signed)
Please inform the patient that his labs indicate that he is prediabetic.  I recommend decreasing his intake of high sugar foods and increasing his physical activities.  All other labs are stable

## 2022-08-28 ENCOUNTER — Telehealth: Payer: Self-pay

## 2022-08-28 DIAGNOSIS — R918 Other nonspecific abnormal finding of lung field: Secondary | ICD-10-CM

## 2022-08-28 NOTE — Progress Notes (Unsigned)
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 Luan Pulling for COPD   Dyspnea:  walking at [place] is a struggle  Cough: better p aecopd rx with doxy Sleep: level bed/ 2 pillows  SABA use: 2 puffs x 3 per day and neb up to bid  Rec Plan A = Automatic = Always=    Trelegy 132 one click take 2 good drags each am then rinse and gargle Plan B = Backup (to supplement plan A, not to replace it) Only use your albuterol inhaler as a rescue medication Plan C = Crisis (instead of Plan B but only if Plan B stops working) - only use your albuterol nebulizer if you first try Plan B and it fails to help  Ok to try albuterol 15 min before an activity (on alternating days)  that you know would usually make you short of breath  -  Allergy screen  12/25/21  >  Eos 0.4 - Labs ordered 02/11/2022  :    alpha one AT phenotype  MM level 177  - PFT's  03/26/22  FEV1 1.32  (36 % ) ratio 0.38  p 10 % improvement from saba p Trelegy prior to study with DLCO  9 (32%)  FV curve classically concave  - LDCT 03/06/22 RADS 2 MPN all < 7.2 mm    05/20/2022  f/u ov/[place] office/Roel Douthat re: GOLD  3 still smoking maint on trelegy  100 worse hoarseness but less saba need, better ex tol  Chief Complaint  Patient presents with   Follow-up    Breathing doing a little better  CT done 03/06/22  Dyspnea:  [employer] walking ok now / pushing mowing  Cough: mucus slt beige, worse in am Sleeping: level bed with 2 pillows  SABA use: 4 x daily and at hs  02: none  Covid status: none  Lung  cancer screening: in program Rec Plan A = Automatic = Always=  stop trelegy> change to    [employer] T Plan B = Backup (to supplement plan A, not to replace it) Only use your albuterol inhaler as a rescue medication Plan C = Crisis (instead of Plan B but only if Plan B stops working) - only use your albuterol nebulizer if you first try [place] to try albuterol 15 min before an activity (on alternating days)  that you know would usually make you short of breath    08/29/2022  f/u ov/ office/Tajah Noguchi re: *** maint on ***  No chief complaint on file.   Dyspnea:  *** Cough: *** Sleeping: *** SABA use: *** 02: *** Covid status: *** Lung cancer screening: ***   No obvious day to day or daytime variability or assoc excess/ purulent sputum or mucus plugs or hemoptysis or cp or chest tightness, subjective wheeze or overt sinus or hb symptoms.   *** without nocturnal  or early am exacerbation  of respiratory  c/o's or  need for noct saba. Also denies any obvious fluctuation of symptoms with weather or environmental changes or other aggravating or alleviating factors except as outlined above   No unusual exposure hx or h/o childhood pna/ asthma or knowledge of premature birth.  Current Allergies, Complete Past Medical History, Past Surgical History, Family History, and Social History were reviewed in [employer] record.  ROS  The following are not active complaints unless bolded Hoarseness, sore throat, dysphagia, dental problems, itching, sneezing,  nasal congestion or discharge of excess mucus or purulent secretions, ear ache,   fever, chills, sweats, unintended wt loss or wt gain, classically pleuritic or exertional cp,  orthopnea pnd or arm/hand swelling  or leg swelling, presyncope, palpitations, abdominal pain, anorexia, nausea, vomiting, diarrhea  or change in bowel habits or change in bladder habits, change in stools or change in urine, dysuria, hematuria,   rash, arthralgias, visual complaints, headache, numbness, weakness or ataxia or problems with walking or coordination,  change in mood or  memory.        No outpatient medications have been marked as taking for the 08/29/22 encounter (Appointment) with Tanda Rockers, MD.           Objective:    Wts  08/29/2022         ***   05/20/22 118 lb 12.8 oz (53.9 kg)  03/27/22 121 lb 1.9 oz (54.9 kg)  02/11/22 119 lb 6.4 oz (54.2 kg)    Vital signs reviewed  08/29/2022  - Note at rest 02 sats  ***% on ***   General appearance:    ***     Mod barr***         Assessment

## 2022-08-28 NOTE — Telephone Encounter (Signed)
-----  Message from Tanda Rockers, MD sent at 03/07/2022  3:59 PM EDT ----- CT chest f/u nodule seen on prior lung cancer screen due 09/06/22

## 2022-08-28 NOTE — Telephone Encounter (Signed)
CT ordered. 

## 2022-08-29 ENCOUNTER — Encounter: Payer: Self-pay | Admitting: Internal Medicine

## 2022-08-29 ENCOUNTER — Ambulatory Visit (INDEPENDENT_AMBULATORY_CARE_PROVIDER_SITE_OTHER): Payer: BC Managed Care – PPO | Admitting: Internal Medicine

## 2022-08-29 VITALS — BP 124/68 | HR 90 | Temp 98.6°F | Ht 69.0 in | Wt 120.4 lb

## 2022-08-29 DIAGNOSIS — F17210 Nicotine dependence, cigarettes, uncomplicated: Secondary | ICD-10-CM

## 2022-08-29 DIAGNOSIS — R918 Other nonspecific abnormal finding of lung field: Secondary | ICD-10-CM | POA: Diagnosis not present

## 2022-08-29 DIAGNOSIS — J449 Chronic obstructive pulmonary disease, unspecified: Secondary | ICD-10-CM | POA: Diagnosis not present

## 2022-08-29 MED ORDER — TRELEGY ELLIPTA 200-62.5-25 MCG/ACT IN AEPB: INHALATION_SPRAY | RESPIRATORY_TRACT | 11 refills | Status: DC

## 2022-08-29 NOTE — Assessment & Plan Note (Addendum)
Active smoker -  Allergy screen  12/25/21  >  Eos 0.4 - Labs ordered 02/11/2022  :    alpha one AT phenotype  MM level 177  - 02/11/2022    try trelegy 578 one click each am  - PFT's  03/26/22  FEV1 1.32  (36 % ) ratio 0.38  p 10 % improvement from saba p Trelegy prior to study with DLCO  9 (32%)  FV curve classically concave  - 05/20/2022  After extensive coaching inhaler device,  effectiveness =    90% with hfa > changed to breztri due to hoarseness on Trelegy > preferred  Trelegy 08/29/2022 so ok to resume   Group D (now reclassified as E) in terms of symptom/risk and laba/lama/ICS  therefore appropriate rx at this point >>>  trelegy and more approp saba use (had started to make some progress on Breztri, will see if regresses back on Trelegy)   Re SABA :  I spent extra time with pt today reviewing appropriate use of albuterol for prn use on exertion with the following points: 1) saba is for relief of sob that does not improve by walking a slower pace or resting but rather if the pt does not improve after trying this first. 2) If the pt is convinced, as many are, that saba helps recover from activity faster then it's easy to tell if this is the case by re-challenging : ie stop, take the inhaler, then p 5 minutes try the exact same activity (intensity of workload) that just caused the symptoms and see if they are substantially diminished or not after saba 3) if there is an activity that reproducibly causes the symptoms, try the saba 15 min before the activity on alternate days   If in fact the saba really does help, then fine to continue to use it prn but advised may need to look closer at the maintenance regimen being used to achieve better control of airways disease with exertion.

## 2022-08-29 NOTE — Assessment & Plan Note (Signed)
Counseled re importance of smoking cessation but did not meet time criteria for separate billing         F/u q 57m- call sooner if needed  Each maintenance medication was reviewed in detail including emphasizing most importantly the difference between maintenance and prns and under what circumstances the prns are to be triggered using an action plan format where appropriate.  Total time for H and P, chart review, counseling, reviewing hfa/dpi device(s) and generating customized AVS unique to this office visit / same day charting = 25 min

## 2022-08-29 NOTE — Assessment & Plan Note (Signed)
LDSCT chest  03/06/22  1. Lung-RADS 3, probably benign findings. Short-term follow-up in 6 months is recommended with repeat low-dose chest CT without contrast (please use the following order, "CT CHEST LCS NODULE FOLLOW-UP W/O CM"). Multiple pulmonary nodules of maximally volume derived equivalent diameter 7.4 mm>> repeat 09/25/22 planned

## 2022-08-29 NOTE — Patient Instructions (Addendum)
Ok to restart trelegy 549 one click each am - if more hoarse, more cough, more need for albuterol  then rec back on breztri.    Ok to try albuterol 15 min before an activity (on alternating days)  that you know would usually make you short of breath and see if it makes any difference and if makes none then don't take albuterol after activity unless you can't catch your breath as this means it's the resting that helps, not the albuterol.  Please schedule a follow up visit in 6 months but call sooner if needed

## 2022-09-12 DIAGNOSIS — R030 Elevated blood-pressure reading, without diagnosis of hypertension: Secondary | ICD-10-CM | POA: Diagnosis not present

## 2022-09-12 DIAGNOSIS — J069 Acute upper respiratory infection, unspecified: Secondary | ICD-10-CM | POA: Diagnosis not present

## 2022-09-19 ENCOUNTER — Ambulatory Visit (HOSPITAL_COMMUNITY): Payer: BC Managed Care – PPO

## 2022-09-25 ENCOUNTER — Ambulatory Visit
Admission: RE | Admit: 2022-09-25 | Discharge: 2022-09-25 | Disposition: A | Discharge status: Home / Self Care | Payer: BC Managed Care – PPO | Source: Ambulatory Visit | Attending: Internal Medicine | Admitting: Internal Medicine

## 2022-09-25 DIAGNOSIS — R918 Other nonspecific abnormal finding of lung field: Secondary | ICD-10-CM

## 2022-09-25 DIAGNOSIS — J439 Emphysema, unspecified: Secondary | ICD-10-CM | POA: Diagnosis not present

## 2022-09-25 DIAGNOSIS — I700 Atherosclerosis of aorta: Secondary | ICD-10-CM | POA: Diagnosis not present

## 2022-09-25 DIAGNOSIS — R911 Solitary pulmonary nodule: Secondary | ICD-10-CM | POA: Diagnosis not present

## 2022-09-26 ENCOUNTER — Ambulatory Visit: Payer: BC Managed Care – PPO | Admitting: Family Medicine

## 2022-09-26 ENCOUNTER — Encounter: Payer: Self-pay | Admitting: Family Medicine

## 2022-09-30 NOTE — Progress Notes (Signed)
Called the pt and there was no answer- LMTCB.  

## 2022-10-06 ENCOUNTER — Other Ambulatory Visit: Payer: Self-pay | Admitting: Family Medicine

## 2022-10-06 DIAGNOSIS — E559 Vitamin D deficiency, unspecified: Secondary | ICD-10-CM

## 2022-10-08 ENCOUNTER — Other Ambulatory Visit: Payer: Self-pay | Admitting: Family Medicine

## 2022-10-08 DIAGNOSIS — R399 Unspecified symptoms and signs involving the genitourinary system: Secondary | ICD-10-CM

## 2022-10-22 ENCOUNTER — Encounter: Payer: Self-pay | Admitting: Family Medicine

## 2022-10-22 ENCOUNTER — Ambulatory Visit: Payer: BC Managed Care – PPO | Admitting: Family Medicine

## 2022-10-22 VITALS — BP 104/67 | HR 97 | Ht 69.0 in | Wt 118.2 lb

## 2022-10-22 DIAGNOSIS — Z720 Tobacco use: Secondary | ICD-10-CM

## 2022-10-22 DIAGNOSIS — J301 Allergic rhinitis due to pollen: Secondary | ICD-10-CM | POA: Diagnosis not present

## 2022-10-22 DIAGNOSIS — R7303 Prediabetes: Secondary | ICD-10-CM

## 2022-10-22 DIAGNOSIS — J449 Chronic obstructive pulmonary disease, unspecified: Secondary | ICD-10-CM | POA: Diagnosis not present

## 2022-10-22 DIAGNOSIS — F17210 Nicotine dependence, cigarettes, uncomplicated: Secondary | ICD-10-CM

## 2022-10-22 DIAGNOSIS — E038 Other specified hypothyroidism: Secondary | ICD-10-CM

## 2022-10-22 DIAGNOSIS — E559 Vitamin D deficiency, unspecified: Secondary | ICD-10-CM

## 2022-10-22 DIAGNOSIS — E7849 Other hyperlipidemia: Secondary | ICD-10-CM

## 2022-10-22 DIAGNOSIS — R399 Unspecified symptoms and signs involving the genitourinary system: Secondary | ICD-10-CM

## 2022-10-22 MED ORDER — FLUTICASONE PROPIONATE 50 MCG/ACT NA SUSP: 2.0000 | Freq: Every day | NASAL | 6 refills | Status: DC

## 2022-10-22 MED ORDER — LEVOCETIRIZINE DIHYDROCHLORIDE 5 MG PO TABS: 5.0000 mg | ORAL_TABLET | Freq: Every evening | ORAL | 2 refills | Status: DC

## 2022-10-22 NOTE — Assessment & Plan Note (Signed)
Smokes about 1 pack/day  Asked about quitting: confirms that he currently smokes cigarettes Advise to quit smoking: Educated about QUITTING to reduce the risk of cancer, cardio and cerebrovascular disease. Assess willingness: Unwilling to quit at this time, but is working on a 3 month  plan to quit. Assist with counseling and pharmacotherapy: Counseled for 5 minutes and literature provided. Arrange for follow up: follow up in 3 months and continue to offer help.

## 2022-10-22 NOTE — Patient Instructions (Signed)
I appreciate the opportunity to provide care to you today!    Follow up:  3 months  Labs: please stop by the lab today to get your blood drawn (CBC, CMP, TSH, Lipid profile, HgA1c, Vit D)  -Please pick up your medications at the pharmacy  Smoking is harmful to your health and increases your risk for cancer, COPD, high blood pressure, cataracts, digestive problems, or health problems , such as gum disease, mouth sores, and tooth loss and loss of taste and smell. Smoking irritates your throat and causes coughing.    Please continue to a heart-healthy diet and increase your physical activities. Try to exercise for 21mins at least five days a week.      It was a pleasure to see you and I look forward to continuing to work together on your health and well-being. Please do not hesitate to call the office if you need care or have questions about your care.   Have a wonderful day and week. With Gratitude, Alvira Monday MSN, FNP-BC

## 2022-10-22 NOTE — Progress Notes (Signed)
Established Patient Office Visit  Subjective:  Patient ID: Jerry Schaefer, male    DOB: 12/29/1965  Age: 57 y.o. MRN: [id number]  CC:  Chief Complaint  Patient presents with   Follow-up    Following up. Pt reports seasonal allergies, having some phlegm coming up since 10/19/22.      HPI Jerry Schaefer is a 57 y.o. male with past medical history of COPD, BPH and tobacco use presents for f/u of  chronic medical conditions. For the details of today's visit, please refer to the assessment and plan.     History reviewed. No pertinent past medical history.  History reviewed. No pertinent surgical history.  History reviewed. No pertinent family history.  Social History   Socioeconomic History   Marital status: Married    Spouse name: Not on file   Number of children: Not on file   Years of education: Not on file   Highest education level: Not on file  Occupational History   Not on file  Tobacco Use   Smoking status: Every Day    Packs/day: 1    Types: Cigarettes   Smokeless tobacco: Never   Tobacco comments:    Smoking 20 cigarettes a day.   Substance and Sexual Activity   Alcohol use: No   Drug use: No   Sexual activity: Not on file  Other Topics Concern   Not on file  Social History Narrative   Not on file   Social Determinants of Health   Financial Resource Strain: Not on file  Food Insecurity: Not on file  Transportation Needs: Not on file  Physical Activity: Not on file  Stress: Not on file  Social Connections: Not on file  Intimate Partner Violence: Not on file    Outpatient Medications Prior to Visit  Medication Sig Dispense Refill   albuterol (VENTOLIN HFA) 108 (90 Base) MCG/ACT inhaler INHALE 1-2 PUFFS INTO THE LUNGS EVERY 4-6 HOURS AS NEEDED FOR WHEEZING OR SHORTNESS OF BREATH 8.5 g 2   Fluticasone-Umeclidin-Vilant (TRELEGY ELLIPTA) 200-62.5-25 MCG/ACT AEPB One click each am 1 each 11   tamsulosin (FLOMAX) 0.4 MG CAPS capsule TAKE ONE CAPSULE BY  MOUTH EVERY DAY 30 capsule 3   Vitamin D, Ergocalciferol, (DRISDOL) 1.25 MG (50000 UNIT) CAPS capsule TAKE ONE CAPSULE BY MOUTH EVERY 7 DAYS 5 capsule 3   No facility-administered medications prior to visit.    No Known Allergies  ROS Review of Systems  Constitutional:  Negative for fatigue and fever.  HENT:  Positive for congestion.   Eyes:  Negative for visual disturbance.  Respiratory:  Negative for choking, wheezing and stridor.   Cardiovascular:  Negative for chest pain and palpitations.  Neurological:  Negative for dizziness and headaches.      Objective:    Physical Exam HENT:     Head: Normocephalic.     Right Ear: External ear normal.     Left Ear: External ear normal.     Nose: No congestion or rhinorrhea.     Mouth/Throat:     Mouth: Mucous membranes are moist.  Cardiovascular:     Rate and Rhythm: Regular rhythm.     Heart sounds: No murmur heard. Pulmonary:     Effort: No respiratory distress.     Breath sounds: Normal breath sounds.  Neurological:     Mental Status: He is alert.     BP 104/67   Pulse 97   Ht 5\' 9"  (1.753 m)   Wt 118 lb 3.2 oz (  53.6 kg)   SpO2 (!) 88%   BMI 17.46 kg/m  Wt Readings from Last 3 Encounters:  10/22/22 118 lb 3.2 oz (53.6 kg)  08/29/22 120 lb 6.4 oz (54.6 kg)  06/26/22 120 lb 1.9 oz (54.5 kg)    Lab Results  Component Value Date   TSH 1.400 07/01/2022   Lab Results  Component Value Date   WBC 7.6 07/01/2022   HGB 13.8 07/01/2022   HCT 40.5 07/01/2022   MCV 96 07/01/2022   PLT 274 07/01/2022   Lab Results  Component Value Date   NA 140 07/01/2022   K 3.9 07/01/2022   CO2 23 07/01/2022   GLUCOSE 114 (H) 07/01/2022   BUN 10 07/01/2022   CREATININE 0.78 07/01/2022   BILITOT 0.3 07/01/2022   ALKPHOS 68 07/01/2022   AST 14 07/01/2022   ALT 9 07/01/2022   PROT 6.8 07/01/2022   ALBUMIN 4.1 07/01/2022   CALCIUM 9.2 07/01/2022   ANIONGAP 9 12/27/2016   EGFR 105 07/01/2022   Lab Results  Component Value  Date   CHOL 144 07/01/2022   Lab Results  Component Value Date   HDL 47 07/01/2022   Lab Results  Component Value Date   LDLCALC 85 07/01/2022   Lab Results  Component Value Date   TRIG 55 07/01/2022   Lab Results  Component Value Date   CHOLHDL 3.1 07/01/2022   Lab Results  Component Value Date   HGBA1C 6.0 (H) 07/01/2022      Assessment & Plan:  COPD GOLD 3 Assessment & Plan: Followed up with pulmonary on 08/29/2022 He denies increased hoarseness, cough and use of his rescue inhaler He is currently on Trelegy maintenance inhaler and reports compliance with treatment regimen Encouraged to continue on therapy   Seasonal allergic rhinitis due to pollen Assessment & Plan: Reports increased nasal congestion, noting to have seasonal allergies due to pollen Will be treated today with Xyzal 5 mg nightly and Flonase nasal spray   Orders: -     Levocetirizine Dihydrochloride; Take 1 tablet (5 mg total) by mouth every evening.  Dispense: 30 tablet; Refill: 2 -     Fluticasone Propionate; Place 2 sprays into both nostrils daily.  Dispense: 16 g; Refill: 6  Lower urinary tract symptoms (LUTS) Assessment & Plan: Well-controlled on Flomax 0.4 mg daily Denies LUTS symptoms   Cigarette smoker Assessment & Plan: Smokes about 1 pack/day  Asked about quitting: confirms that he currently smokes cigarettes Advise to quit smoking: Educated about QUITTING to reduce the risk of cancer, cardio and cerebrovascular disease. Assess willingness: Unwilling to quit at this time, but is working on a 3 month  plan to quit. Assist with counseling and pharmacotherapy: Counseled for 5 minutes and literature provided. Arrange for follow up: follow up in 3 months and continue to offer help.    Vitamin D deficiency -     VITAMIN D 25 Hydroxy (Vit-D Deficiency, Fractures)  Other specified hypothyroidism -     TSH + free T4  Other hyperlipidemia -     CMP14+EGFR -     CBC with  Differential/Platelet -     Lipid panel  Tobacco use  Prediabetes -     Hemoglobin A1c    Follow-up: Return in about 3 months (around 01/22/2023).   Alvira Monday, FNP

## 2022-10-22 NOTE — Assessment & Plan Note (Signed)
Well-controlled on Flomax 0.4 mg daily Denies LUTS symptoms

## 2022-10-22 NOTE — Assessment & Plan Note (Signed)
Reports increased nasal congestion, noting to have seasonal allergies due to pollen Will be treated today with Xyzal 5 mg nightly and Flonase nasal spray

## 2022-10-22 NOTE — Assessment & Plan Note (Signed)
Followed up with pulmonary on 08/29/2022 He denies increased hoarseness, cough and use of his rescue inhaler He is currently on Trelegy maintenance inhaler and reports compliance with treatment regimen Encouraged to continue on therapy

## 2022-10-24 DIAGNOSIS — E559 Vitamin D deficiency, unspecified: Secondary | ICD-10-CM | POA: Diagnosis not present

## 2022-10-24 DIAGNOSIS — E038 Other specified hypothyroidism: Secondary | ICD-10-CM | POA: Diagnosis not present

## 2022-10-24 DIAGNOSIS — R7303 Prediabetes: Secondary | ICD-10-CM | POA: Diagnosis not present

## 2022-10-24 DIAGNOSIS — E7849 Other hyperlipidemia: Secondary | ICD-10-CM | POA: Diagnosis not present

## 2022-10-25 LAB — CBC WITH DIFFERENTIAL/PLATELET
Basophils Absolute: 0.1 10*3/uL (ref 0.0–0.2)
Basos: 1 %
EOS (ABSOLUTE): 1 10*3/uL — ABNORMAL HIGH (ref 0.0–0.4)
Eos: 8 %
Hematocrit: 40.7 % (ref 37.5–51.0)
Hemoglobin: 13.5 g/dL (ref 13.0–17.7)
Immature Grans (Abs): 0 10*3/uL (ref 0.0–0.1)
Immature Granulocytes: 0 %
Lymphocytes Absolute: 2.9 10*3/uL (ref 0.7–3.1)
Lymphs: 22 %
MCH: 31.8 pg (ref 26.6–33.0)
MCHC: 33.2 g/dL (ref 31.5–35.7)
MCV: 96 fL (ref 79–97)
Monocytes Absolute: 1.6 10*3/uL — ABNORMAL HIGH (ref 0.1–0.9)
Monocytes: 13 %
Neutrophils Absolute: 7.3 10*3/uL — ABNORMAL HIGH (ref 1.4–7.0)
Neutrophils: 56 %
Platelets: 379 10*3/uL (ref 150–450)
RBC: 4.24 x10E6/uL (ref 4.14–5.80)
RDW: 12.8 % (ref 11.6–15.4)
WBC: 12.8 10*3/uL — ABNORMAL HIGH (ref 3.4–10.8)

## 2022-10-25 LAB — LIPID PANEL
Chol/HDL Ratio: 3.1 ratio (ref 0.0–5.0)
Cholesterol, Total: 125 mg/dL (ref 100–199)
HDL: 40 mg/dL (ref >39)
LDL Chol Calc (NIH): 72 mg/dL (ref 0–99)
Triglycerides: 60 mg/dL (ref 0–149)
VLDL Cholesterol Cal: 13 mg/dL (ref 5–40)

## 2022-10-25 LAB — CMP14+EGFR
ALT: 15 IU/L (ref 0–44)
AST: 13 IU/L (ref 0–40)
Albumin/Globulin Ratio: 1.4 (ref 1.2–2.2)
Albumin: 3.8 g/dL (ref 3.8–4.9)
Alkaline Phosphatase: 74 IU/L (ref 44–121)
BUN/Creatinine Ratio: 14 (ref 9–20)
BUN: 11 mg/dL (ref 6–24)
Bilirubin Total: 0.2 mg/dL (ref 0.0–1.2)
CO2: 23 mmol/L (ref 20–29)
Calcium: 8.9 mg/dL (ref 8.7–10.2)
Chloride: 104 mmol/L (ref 96–106)
Creatinine, Ser: 0.8 mg/dL (ref 0.76–1.27)
Globulin, Total: 2.7 g/dL (ref 1.5–4.5)
Glucose: 112 mg/dL — ABNORMAL HIGH (ref 70–99)
Potassium: 4.1 mmol/L (ref 3.5–5.2)
Sodium: 140 mmol/L (ref 134–144)
Total Protein: 6.5 g/dL (ref 6.0–8.5)
eGFR: 105 mL/min/{1.73_m2} (ref >59)

## 2022-10-25 LAB — VITAMIN D 25 HYDROXY (VIT D DEFICIENCY, FRACTURES): Vit D, 25-Hydroxy: 48.2 ng/mL (ref 30.0–100.0)

## 2022-10-25 LAB — HEMOGLOBIN A1C
Est. average glucose Bld gHb Est-mCnc: 126 mg/dL
Hgb A1c MFr Bld: 6 % — ABNORMAL HIGH (ref 4.8–5.6)

## 2022-10-25 LAB — TSH+FREE T4
Free T4: 1.49 ng/dL (ref 0.82–1.77)
TSH: 1.38 u[IU]/mL (ref 0.450–4.500)

## 2022-10-27 NOTE — Progress Notes (Signed)
Your hemoglobin A1c is 6.0, this indicates that you are prediabetic. I recommend avoiding simple carbohydrates, including cakes, sweet desserts, ice cream, soda (diet or regular), sweet tea, candies, chips, cookies, store-bought juices, alcohol in excess of 1-2 drinks a day, lemonade, artificial sweeteners, donuts, coffee creamers, and sugar-free products.  I recommend avoiding greasy, fatty foods with increased physical activity.  All other labs are stable.

## 2022-11-08 DIAGNOSIS — J441 Chronic obstructive pulmonary disease with (acute) exacerbation: Secondary | ICD-10-CM | POA: Diagnosis not present

## 2023-01-23 ENCOUNTER — Ambulatory Visit: Payer: BC Managed Care – PPO | Admitting: Family Medicine

## 2023-01-23 NOTE — Progress Notes (Signed)
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 [name] for COPD   Dyspnea:  walking at [employer] is a struggle  Cough: better p aecopd rx with doxy Sleep: level bed/ 2 pillows  SABA use: 2 puffs x 3 per day and neb up to bid  Rec Plan A = Automatic = Always=    Trelegy 100 one click take 2 good drags each am then rinse and gargle Plan B = Backup (to supplement plan A, not to replace it) Only use your albuterol inhaler as a rescue medication Plan C = Crisis (instead of Plan B but only if Plan B stops working) - only use your albuterol nebulizer if you first try Plan B and it fails to help  Ok to try albuterol 15 min before an activity (on alternating days)  that you know would usually make you short of breath  -  Allergy screen  12/25/21  >  Eos 0.4 - Labs ordered 02/11/2022  :    alpha one AT phenotype  MM level 177  - PFT's  03/26/22  FEV1 1.32  (36 % ) ratio 0.38  p 10 % improvement from saba p Trelegy prior to study with DLCO  9 (32%)  FV curve classically concave  - LDCT 03/06/22 RADS 2 MPN all < 7.2 mm    05/20/2022  f/u ov/[place] [facility]/Cecily [place]hor[place] re: GOLD  3 still smoking maint on trelegy  100 worse hoarseness but less saba need, better ex tol  Chief Complaint  Patient presents with   Follow-up    Breathing doing a little better  CT done 03/06/22  Dyspnea:  [employer] walking ok now / pushing mower Cough: mucus slt beige, worse in am Sleeping: level bed with 2 pillows  SABA use: 4 x daily and at hs  02: none  Covid status: none  Lung  cancer screening: in program Rec Plan A = Automatic = Always=  stop trelegy> change to    [employer] T Plan B = Backup (to supplement plan A, not to replace it) Only use your albuterol inhaler as a rescue medication Plan C = Crisis (instead of Plan B but only if Plan B stops working) - only use your albuterol nebulizer if you first try Plan B  Also  Ok to try albuterol 15 min before an activity (on alternating days)  that you know would usually make you short of breath    08/29/2022  f/u ov/ office/Glennice Marcos re: GOLD 3  maint on breztri   Chief Complaint  Patient presents with   Follow-up    Feels he is a little better since last ov Wants to discuss going back to trelegy inhaler   Dyspnea:  fast walk /  Cough: worse in am/ slt beige /smoker's rattle / less hoarse off trelegy  Sleeping: flat bed / 2 pillows  SABA use: 2-3 per day -thinks it helps if used before exertion but not using alternate  days as rec  02: none  Covid status: never vax / never infected  Rec Ok to restart trelegy 100 one click each am - if more hoarse, more cough, more need for albuterol  then rec back on breztri. Ok to try albuterol 15 min before an activity (on alternating days)  that you know would usually make you short of breath   Please schedule a follow up visit in 6 months but call sooner if needed    01/24/2023  f/u ov/[place] office/Liberty Seto re: GOLD 3 copd maint on trelegy 200   Chief Complaint  Patient presents with   Follow-up  Dyspnea:  fast still fine flat fast  Cough: min clear more daytime  Sleeping: flat/ 2 pillows  SABA use: 6 x daily  02: none    Lung cancer screening: in program    No obvious day to day or daytime variability or assoc excess/ purulent sputum or mucus plugs or hemoptysis or cp or chest tightness, subjective wheeze or overt sinus or hb symptoms.   Sleeping  without nocturnal  or early am exacerbation  of respiratory  c/o's or need for noct saba. Also denies any obvious  fluctuation of symptoms with weather or environmental changes or other aggravating or alleviating factors except as outlined above   No unusual exposure hx or h/o childhood pna/ asthma or knowledge of premature birth.  Current Allergies, Complete Past Medical History, Past Surgical History, Family History, and Social History were reviewed in [name].  ROS  The following are not active complaints unless bolded Hoarseness, sore throat, dysphagia, dental problems, itching, sneezing,  nasal congestion or discharge of excess mucus or purulent secretions, ear ache,   fever, chills, sweats, unintended wt loss or wt gain, classically pleuritic or exertional cp,  orthopnea pnd or arm/hand swelling  or leg swelling, presyncope, palpitations, abdominal pain, anorexia, nausea, vomiting, diarrhea  or change in bowel habits or change in bladder habits, change in stools or change in urine, dysuria, hematuria,  rash, arthralgias, visual complaints, headache, numbness, weakness or ataxia or problems with walking or coordination,  change in mood or  memory.        Current Meds  Medication Sig   albuterol (PROVENTIL) (2.5 MG/3ML) 0.083% nebulizer solution Take 2.5 mg by nebulization every 6 (six) hours as needed for wheezing or shortness of breath.   albuterol (VENTOLIN HFA) 108 (90 Base) MCG/ACT inhaler INHALE 1-2 PUFFS INTO THE LUNGS EVERY 4-6 HOURS AS NEEDED FOR WHEEZING OR SHORTNESS OF BREATH   fluticasone (FLONASE) 50 MCG/ACT nasal spray Place 2 sprays into both nostrils daily.   Fluticasone-Umeclidin-Vilant (TRELEGY ELLIPTA) 200-62.5-25 MCG/ACT AEPB One click each am   levocetirizine (XYZAL ALLERGY 24HR) 5 MG tablet Take 1 tablet (5 mg total) by mouth every evening.   tamsulosin (FLOMAX) 0.4 MG CAPS capsule TAKE ONE CAPSULE BY MOUTH EVERY DAY   Vitamin D, Ergocalciferol, (DRISDOL) 1.25 MG (50000 UNIT) CAPS capsule TAKE ONE CAPSULE BY MOUTH EVERY 7 DAYS              Objective:    Wts  01/24/2023       109   08/29/2022         120   05/20/22 118 lb 12.8 oz (53.9 kg)  03/27/22 121 lb 1.9 oz (54.9 kg)  02/11/22 119 lb 6.4 oz (54.2 kg)    Vital signs reviewed  01/24/2023  - Note at rest 02 sats  92% on RA  General appearance:    amb thin wm/ congested coughing, freq throat clearing   HEENT :  Oropharynx  clear      NECK :  without JVD/Nodes/TM/ nl carotid upstrokes bilaterally   LUNGS: no acc muscle use,  Mod barrel  contour chest wall with bilateral  Distant exp wheeze and  without cough on insp or exp maneuvers and mod  Hyperresonant  to  percussion bilaterally     CV:  RRR  no s3 or murmur or increase in P2, and no edema   ABD:  soft and nontender with pos mid insp Hoover's  in the supine position. No bruits or organomegaly appreciated, bowel sounds nl  MS:   Ext warm without deformities or   obvious joint restrictions , calf tenderness, cyanosis or clubbing  SKIN: warm and dry without lesions    NEURO:  alert, approp, nl sensorium with  no motor or cerebellar deficits apparent.                        Assessment

## 2023-01-24 ENCOUNTER — Ambulatory Visit (INDEPENDENT_AMBULATORY_CARE_PROVIDER_SITE_OTHER): Payer: BC Managed Care – PPO | Admitting: Internal Medicine

## 2023-01-24 ENCOUNTER — Encounter: Payer: Self-pay | Admitting: Internal Medicine

## 2023-01-24 VITALS — BP 108/65 | HR 95 | Ht 69.0 in | Wt 109.2 lb

## 2023-01-24 DIAGNOSIS — J449 Chronic obstructive pulmonary disease, unspecified: Secondary | ICD-10-CM | POA: Diagnosis not present

## 2023-01-24 DIAGNOSIS — F17210 Nicotine dependence, cigarettes, uncomplicated: Secondary | ICD-10-CM

## 2023-01-24 MED ORDER — PREDNISONE 10 MG PO TABS: ORAL_TABLET | ORAL | 11 refills | Status: DC

## 2023-01-24 MED ORDER — BREZTRI AEROSPHERE 160-9-4.8 MCG/ACT IN AERO: INHALATION_SPRAY | RESPIRATORY_TRACT | 11 refills | Status: DC

## 2023-01-24 NOTE — Assessment & Plan Note (Signed)
Active smoker -  Allergy screen  12/25/21  >  Eos 0.4 - Labs ordered 02/11/2022  :    alpha one AT phenotype  MM level 177  - 02/11/2022    try trelegy 100 one click each am  - PFT's  03/26/22  FEV1 1.32  (36 % ) ratio 0.38  p 10 % improvement from saba p Trelegy prior to study with DLCO  9 (32%)  FV curve classically concave  - 05/20/2022  After extensive coaching inhaler device,  effectiveness =    90% with hfa > changed to breztri due to hoarseness on Trelegy > preferred  Trelegy 08/29/2022 so ok to resume - 01/24/2023  After extensive coaching inhaler device,  effectiveness =    75% from a baseline eof 25 % and too much saba hfa while on trelegy 200 so change to breztri again with approp saba this time and Predisone x 6 day cycles as Plan C    Group D (now reclassified as E) in terms of symptom/risk and laba/lama/ICS  therefore appropriate rx at this point >>>  breztri/ saba and short course prednisone as above  Re SABA :  I spent extra time with pt today reviewing appropriate use of albuterol for prn use on exertion with the following points: 1) saba is for relief of sob that does not improve by walking a slower pace or resting but rather if the pt does not improve after trying this first. 2) If the pt is convinced, as many are, that saba helps recover from activity faster then it's easy to tell if this is the case by re-challenging : ie stop, take the inhaler, then p 5 minutes try the exact same activity (intensity of workload) that just caused the symptoms and see if they are substantially diminished or not after saba 3) if there is an activity that reproducibly causes the symptoms, try the saba 15 min before the activity on alternate days   If in fact the saba really does help, then fine to continue to use it prn but advised may need to look closer at the maintenance regimen being used to achieve better control of airways disease with exertion.

## 2023-01-24 NOTE — Assessment & Plan Note (Signed)
Counseled re importance of smoking cessation but did not meet time criteria for separate billing    Already in lung cancer screening program/ discussed         Each maintenance medication was reviewed in detail including emphasizing most importantly the difference between maintenance and prns and under what circumstances the prns are to be triggered using an action plan format where appropriate.  Total time for H and P, chart review, counseling, reviewing hfa device(s) and generating customized AVS unique to this office visit / same day charting > 30 min for   refractory respiratory  symptoms

## 2023-01-24 NOTE — Patient Instructions (Addendum)
Plan A = Automatic = Always=    Breztri Take 2 puffs first thing in am and then another 2 puffs about 12 hours later.    Work on inhaler technique:  relax and gently blow all the way out then take a nice smooth full deep breath back in, triggering the inhaler at same time you start breathing in.  Hold breath in for at least  5 seconds if you can. Blow out breztri  thru nose. Rinse and gargle with water when done.  If mouth or throat bother you at all,  try brushing teeth/gums/tongue with arm and hammer toothpaste/ make a slurry and gargle and spit out.  >>>  Remember how golfers warm up by taking practice swings - do this with an empty inhaler   Plan B = Backup (to supplement plan A, not to replace it) Only use your albuterol inhaler as a rescue medication to be used if you can't catch your breath by resting or doing a relaxed purse lip breathing pattern.  - The less you use it, the better it will work when you need it. - Ok to use the inhaler up to 2 puffs  every 4 hours if you must but call for appointment if use goes up over your usual need - Don't leave home without it !!  (think of it like the spare tire for your car)   Plan C = if still needing a lot of Plan B > Prednisone 10 mg take  4 each am x 2 days,   2 each am x 2 days,  1 each am x 2 days and stop    For cough > mucinex dm take up to 1200mg  every 12hours as needed   The key is to stop smoking completely before smoking completely stops you!  Please schedule a follow up visit in 3 months but call sooner if needed

## 2023-02-03 ENCOUNTER — Emergency Department (HOSPITAL_COMMUNITY)
Admission: EM | Admit: 2023-02-03 | Discharge: 2023-02-04 | Disposition: A | Discharge status: Home / Self Care | Payer: BC Managed Care – PPO | Attending: Emergency Medicine | Admitting: Emergency Medicine

## 2023-02-03 DIAGNOSIS — R0602 Shortness of breath: Secondary | ICD-10-CM | POA: Diagnosis not present

## 2023-02-03 DIAGNOSIS — F17200 Nicotine dependence, unspecified, uncomplicated: Secondary | ICD-10-CM | POA: Diagnosis not present

## 2023-02-03 DIAGNOSIS — Z7951 Long term (current) use of inhaled steroids: Secondary | ICD-10-CM | POA: Insufficient documentation

## 2023-02-03 DIAGNOSIS — R0689 Other abnormalities of breathing: Secondary | ICD-10-CM | POA: Diagnosis not present

## 2023-02-03 DIAGNOSIS — J441 Chronic obstructive pulmonary disease with (acute) exacerbation: Secondary | ICD-10-CM | POA: Diagnosis not present

## 2023-02-03 DIAGNOSIS — R918 Other nonspecific abnormal finding of lung field: Secondary | ICD-10-CM | POA: Diagnosis not present

## 2023-02-03 DIAGNOSIS — D72829 Elevated white blood cell count, unspecified: Secondary | ICD-10-CM | POA: Insufficient documentation

## 2023-02-03 DIAGNOSIS — R069 Unspecified abnormalities of breathing: Secondary | ICD-10-CM | POA: Diagnosis not present

## 2023-02-03 HISTORY — DX: Chronic obstructive pulmonary disease, unspecified: J44.9

## 2023-02-03 NOTE — ED Triage Notes (Signed)
Presents from home for COPD exacerbation.  Since thurs has been experiencing increased SOB. Tried albuterol neb x2 at home. 90% RA for EMS, wheezing noted, given addition neb x2 (albuterol) en route. EMS VS otherwise 134SBP.

## 2023-02-04 ENCOUNTER — Encounter (HOSPITAL_COMMUNITY): Payer: Self-pay | Admitting: Emergency Medicine

## 2023-02-04 ENCOUNTER — Other Ambulatory Visit: Payer: Self-pay

## 2023-02-04 ENCOUNTER — Emergency Department (HOSPITAL_COMMUNITY): Payer: BC Managed Care – PPO

## 2023-02-04 DIAGNOSIS — R918 Other nonspecific abnormal finding of lung field: Secondary | ICD-10-CM | POA: Diagnosis not present

## 2023-02-04 DIAGNOSIS — R0602 Shortness of breath: Secondary | ICD-10-CM | POA: Diagnosis not present

## 2023-02-04 LAB — BASIC METABOLIC PANEL
Anion gap: 8 (ref 5–15)
BUN: 18 mg/dL (ref 6–20)
CO2: 27 mmol/L (ref 22–32)
Calcium: 8.5 mg/dL — ABNORMAL LOW (ref 8.9–10.3)
Chloride: 101 mmol/L (ref 98–111)
Creatinine, Ser: 0.86 mg/dL (ref 0.61–1.24)
GFR, Estimated: >60 mL/min (ref >60)
Glucose, Bld: 120 mg/dL — ABNORMAL HIGH (ref 70–99)
Potassium: 3.9 mmol/L (ref 3.5–5.1)
Sodium: 136 mmol/L (ref 135–145)

## 2023-02-04 LAB — CBC
HCT: 42.7 % (ref 39.0–52.0)
Hemoglobin: 14.1 g/dL (ref 13.0–17.0)
MCH: 32.5 pg (ref 26.0–34.0)
MCHC: 33 g/dL (ref 30.0–36.0)
MCV: 98.4 fL (ref 80.0–100.0)
Platelets: 235 10*3/uL (ref 150–400)
RBC: 4.34 MIL/uL (ref 4.22–5.81)
RDW: 14 % (ref 11.5–15.5)
WBC: 22.2 10*3/uL — ABNORMAL HIGH (ref 4.0–10.5)
nRBC: 0 % (ref 0.0–0.2)

## 2023-02-04 MED ORDER — PREDNISONE 50 MG PO TABS
60.0000 mg | ORAL_TABLET | Freq: Once | ORAL | Status: AC
  Administered 2023-02-04: 60 mg via ORAL
  Filled 2023-02-04: qty 1

## 2023-02-04 MED ORDER — PREDNISONE 50 MG PO TABS: 50.0000 mg | ORAL_TABLET | Freq: Every day | ORAL | 0 refills | Status: DC

## 2023-02-04 MED ORDER — DOXYCYCLINE HYCLATE 100 MG PO TABS
100.0000 mg | ORAL_TABLET | Freq: Once | ORAL | Status: AC
  Administered 2023-02-04: 100 mg via ORAL
  Filled 2023-02-04: qty 1

## 2023-02-04 MED ORDER — DOXYCYCLINE HYCLATE 100 MG PO CAPS: 100.0000 mg | ORAL_CAPSULE | Freq: Two times a day (BID) | ORAL | 0 refills | Status: DC

## 2023-02-04 MED ORDER — IPRATROPIUM BROMIDE 0.02 % IN SOLN
0.5000 mg | Freq: Once | RESPIRATORY_TRACT | Status: AC
  Administered 2023-02-04: 0.5 mg via RESPIRATORY_TRACT
  Filled 2023-02-04: qty 2.5

## 2023-02-04 MED ORDER — ALBUTEROL SULFATE (2.5 MG/3ML) 0.083% IN NEBU
5.0000 mg | INHALATION_SOLUTION | Freq: Once | RESPIRATORY_TRACT | Status: AC
  Administered 2023-02-04: 5 mg via RESPIRATORY_TRACT
  Filled 2023-02-04: qty 6

## 2023-02-04 MED ORDER — ALBUTEROL SULFATE (2.5 MG/3ML) 0.083% IN NEBU
10.0000 mg/h | INHALATION_SOLUTION | Freq: Once | RESPIRATORY_TRACT | Status: AC
  Administered 2023-02-04: 10 mg/h via RESPIRATORY_TRACT
  Filled 2023-02-04: qty 12

## 2023-02-04 MED ORDER — ALBUTEROL SULFATE (2.5 MG/3ML) 0.083% IN NEBU: 2.5000 mg | INHALATION_SOLUTION | RESPIRATORY_TRACT | 12 refills | Status: DC | PRN

## 2023-02-04 NOTE — ED Notes (Signed)
Attempted to trial patient on RA, spo2 dropped to 87% during this. Did not attempt to ambulate patient on RA for this reason

## 2023-02-04 NOTE — ED Provider Notes (Signed)
[place] EMERGENCY DEPARTMENT AT [facility] Provider Note   CSN: [id number] Arrival date & time: 02/03/23  2346     History  Chief complaint-shortness of breath   Jerry Schaefer is a 57 y.o. male.  The history is provided by the spouse and the patient.  Patient with history of known COPD presents with increasing cough and shortness of breath.  He reports over the past day he has had increasing shortness of breath and dyspnea on exertion. He reports increased sputum production that is green, no hemoptysis.  No active chest pain.  No fevers.  He is currently smoking.  He recently completed a course of prednisone as given by his pulmonologist   He does not wear home oxygen Denies previous history of CAD/VTE Home Medications Prior to Admission medications   Medication Sig Start Date End Date Taking? Authorizing Provider  albuterol (PROVENTIL) (2.5 MG/3ML) 0.083% nebulizer solution Take 3 mLs (2.5 mg total) by nebulization every 4 (four) hours as needed for wheezing or shortness of breath. 02/04/23  Yes Zadie Rhine, MD  doxycycline (VIBRAMYCIN) 100 MG capsule Take 1 capsule (100 mg total) by mouth 2 (two) times daily. 02/04/23  Yes Zadie Rhine, MD  predniSONE (DELTASONE) 50 MG tablet Take 1 tablet (50 mg total) by mouth daily with breakfast. 1 tablet PO QD X4 days 02/04/23  Yes Zadie Rhine, MD  albuterol (VENTOLIN HFA) 108 (90 Base) MCG/ACT inhaler INHALE 1-2 PUFFS INTO THE LUNGS EVERY 4-6 HOURS AS NEEDED FOR WHEEZING OR SHORTNESS OF BREATH 03/27/22   Gilmore Laroche, FNP  Budeson-Glycopyrrol-Formoterol (BREZTRI AEROSPHERE) 160-9-4.8 MCG/ACT AERO Take 2 puffs first thing in am and then another 2 puffs about 12 hours later. 01/24/23   Nyoka Cowden, MD  fluticasone (FLONASE) 50 MCG/ACT nasal spray Place 2 sprays into both nostrils daily. 10/22/22   Gilmore Laroche, FNP  levocetirizine (XYZAL ALLERGY 24HR) 5 MG tablet Take 1 tablet (5 mg total) by mouth every evening.  10/22/22   Gilmore Laroche, FNP  tamsulosin (FLOMAX) 0.4 MG CAPS capsule TAKE ONE CAPSULE BY MOUTH EVERY DAY 10/08/22   Gilmore Laroche, FNP  Vitamin D, Ergocalciferol, (DRISDOL) 1.25 MG (50000 UNIT) CAPS capsule TAKE ONE CAPSULE BY MOUTH EVERY 7 DAYS 10/07/22   Gilmore Laroche, FNP      Allergies    Patient has no known allergies.    Review of Systems   Review of Systems  Constitutional:  Negative for fever.  Respiratory:  Positive for cough, shortness of breath and wheezing.     Physical Exam Updated Vital Signs BP 101/61   Pulse 67   Temp 98.3 F (36.8 C) (Oral)   Resp 19   Wt 52.2 kg   SpO2 94%   BMI 16.98 kg/m  Physical Exam CONSTITUTIONAL: Chronically ill-appearing, tachypneic HEAD: Normocephalic/atraumatic EYES: EOMI/PERRL ENMT: Mucous membranes moist NECK: supple no meningeal signs CV: S1/S2 noted, no murmurs/rubs/gallops noted LUNGS: Decreased breath sounds bilaterally, wheeze noted, tachypnea ABDOMEN: soft, nontender NEURO: Pt is awake/alert/appropriate, moves all extremitiesx4.  No facial droop.   EXTREMITIES: pulses normal/equal, full ROM, no lower extremity edema SKIN: warm, color normal PSYCH: no abnormalities of mood noted, alert and oriented to situation  ED Results / Procedures / Treatments   Labs (all labs ordered are listed, but only abnormal results are displayed) Labs Reviewed  BASIC METABOLIC PANEL - Abnormal; Notable for the following components:      Result Value   Glucose, Bld 120 (*)    Calcium 8.5 (*)  All other components within normal limits  CBC - Abnormal; Notable for the following components:   WBC 22.2 (*)    All other components within normal limits    EKG EKG Interpretation Date/Time:  Tuesday February 04 2023 00:07:51 EDT Ventricular Rate:  77 PR Interval:  144 QRS Duration:  92 QT Interval:  374 QTC Calculation: 424 R Axis:   90  Text Interpretation: Sinus rhythm Borderline right axis deviation No significant change since  last tracing Confirmed by Zadie Rhine ([id number]) on 02/04/2023 12:20:03 AM  Radiology DG Chest 2 View  Result Date: 02/04/2023 CLINICAL DATA:  Shortness of breath. EXAM: CHEST - 2 VIEW COMPARISON:  February 19, 2019 FINDINGS: The heart size and mediastinal contours are within normal limits. The lungs are hyperinflated with evidence of extensive emphysematous lung disease and diffuse, chronic appearing increased interstitial lung markings. There is mild areas of atelectasis and/or early infiltrate are seen within the right infrahilar region and left lung base. No pleural effusion or pneumothorax is identified. IMPRESSION: Extensive emphysematous lung disease with mild areas of atelectasis and/or early infiltrate within the right infrahilar region and left lung base. Electronically Signed   By: Aram Candela M.D.   On: 02/04/2023 00:51    Procedures .Critical Care  Performed by: Zadie Rhine, MD Authorized by: Zadie Rhine, MD   Critical care provider statement:    Critical care time (minutes):  60   Critical care start time:  02/04/2023 1:30 AM   Critical care end time:  02/04/2023 2:30 AM   Critical care time was exclusive of:  Separately billable procedures and treating other patients   Critical care was necessary to treat or prevent imminent or life-threatening deterioration of the following conditions:  Respiratory failure   Critical care was time spent personally by me on the following activities:  Evaluation of patient's response to treatment, examination of patient, re-evaluation of patient's condition, pulse oximetry, ordering and review of radiographic studies, ordering and review of laboratory studies, review of old charts and ordering and performing treatments and interventions   I assumed direction of critical care for this patient from another provider in my specialty: no   Comments:     Patient given multiple nebulized treatments and steroids for acute exacerbation of COPD.  Also  required oxygen therapy in the emergency department     Medications Ordered in ED Medications  albuterol (PROVENTIL) (2.5 MG/3ML) 0.083% nebulizer solution (10 mg/hr Nebulization Given 02/04/23 0124)  predniSONE (DELTASONE) tablet 60 mg (60 mg Oral Given 02/04/23 0058)  doxycycline (VIBRA-TABS) tablet 100 mg (100 mg Oral Given 02/04/23 0058)  albuterol (PROVENTIL) (2.5 MG/3ML) 0.083% nebulizer solution 5 mg (5 mg Nebulization Given 02/04/23 0338)  ipratropium (ATROVENT) nebulizer solution 0.5 mg (0.5 mg Nebulization Given 02/04/23 0338)    ED Course/ Medical Decision Making/ A&P Clinical Course as of 02/04/23 0502  Tue Feb 04, 2023  0108 WBC(!): 22.2 Leukocytosis, recent prednisone use [DW]  6045 Patient feels improved, though still wheezing.  Will give another round of neb treatments. [DW]  0500 Patient is now feeling improved.  He is resting comfortably in no acute distress.  He has increased air entry.  Pulse ox is hovered in the low 90s.  Patient would like to go home to trial home medications.  This appears to be a reasonable plan.  He was counseled on stopping smoking.  He will be given a prescription for prednisone, doxycycline and albuterol for nebulized treatments for acute exacerbation of COPD. I  have low suspicion for ACS/PE/CHF at this time. He may have an early pneumonia, but will be on oral antibiotics. Discussed with wife and patient strict ER return precautions [DW]    Clinical Course User Index [DW] Zadie Rhine, MD                             Medical Decision Making Amount and/or Complexity of Data Reviewed Labs: ordered. Decision-making details documented in ED Course. Radiology: ordered.  Risk Prescription drug management.   This patient presents to the ED for concern of shortness of breath, this involves an extensive number of treatment options, and is a complaint that carries with it a high risk of complications and morbidity.  The differential diagnosis includes  but is not limited to Acute coronary syndrome, pneumonia, acute pulmonary edema, pneumothorax, acute anemia, pulmonary embolism    Comorbidities that complicate the patient evaluation: Patient's presentation is complicated by their history of COPD  Social Determinants of Health: Patient's  ongoing tobacco use   increases the complexity of managing their presentation  Additional history obtained: Additional history obtained from spouse Records reviewed  pulmonology notes  Lab Tests: I Ordered, and personally interpreted labs.  The pertinent results include: Leukocytosis likely due to recent prednisone use  Imaging Studies ordered: I ordered imaging studies including X-ray chest   I independently visualized and interpreted imaging which showed emphysema with likely infiltrate, no pneumothorax I agree with the radiologist interpretation  Cardiac Monitoring: The patient was maintained on a cardiac monitor.  I personally viewed and interpreted the cardiac monitor which showed an underlying rhythm of:  sinus rhythm  Medicines ordered and prescription drug management: I ordered medication including albuterol, prednisone, doxycycline for acute COPD exacerbation Reevaluation of the patient after these medicines showed that the patient    improved   Critical Interventions:   nebulizer treatments, prednisone   Reevaluation: After the interventions noted above, I reevaluated the patient and found that they have :improved  Complexity of problems addressed: Patient's presentation is most consistent with  acute presentation with potential threat to life or bodily function  Disposition: After consideration of the diagnostic results and the patient's response to treatment,  I feel that the patent would benefit from discharge   .           Final Clinical Impression(s) / ED Diagnoses Final diagnoses:  Acute exacerbation of chronic obstructive pulmonary disease (COPD) (HCC)    Rx  / DC Orders ED Discharge Orders          Ordered    predniSONE (DELTASONE) 50 MG tablet  Daily with breakfast        02/04/23 0456    doxycycline (VIBRAMYCIN) 100 MG capsule  2 times daily        02/04/23 0456    albuterol (PROVENTIL) (2.5 MG/3ML) 0.083% nebulizer solution  Every 4 hours PRN        02/04/23 0456              Zadie Rhine, MD 02/04/23 0503

## 2023-02-05 ENCOUNTER — Ambulatory Visit: Payer: BC Managed Care – PPO | Admitting: Family Medicine

## 2023-02-05 ENCOUNTER — Encounter: Payer: Self-pay | Admitting: Family Medicine

## 2023-02-05 ENCOUNTER — Other Ambulatory Visit: Payer: Self-pay | Admitting: Family Medicine

## 2023-02-05 VITALS — BP 115/66 | HR 101 | Ht 69.0 in | Wt 108.0 lb

## 2023-02-05 DIAGNOSIS — J441 Chronic obstructive pulmonary disease with (acute) exacerbation: Secondary | ICD-10-CM | POA: Diagnosis not present

## 2023-02-05 DIAGNOSIS — R399 Unspecified symptoms and signs involving the genitourinary system: Secondary | ICD-10-CM

## 2023-02-05 DIAGNOSIS — E559 Vitamin D deficiency, unspecified: Secondary | ICD-10-CM

## 2023-02-05 MED ORDER — ALBUTEROL SULFATE (2.5 MG/3ML) 0.083% IN NEBU: 2.5000 mg | INHALATION_SOLUTION | RESPIRATORY_TRACT | 12 refills | Status: AC | PRN

## 2023-02-05 NOTE — Patient Instructions (Addendum)
I appreciate the opportunity to provide care to you today!    Follow up:  04/25/2023  Please continue taking and complete the course of prednisone and doxycyline  A refill of your nebulizer solution is sent to your pharmacy  If you continue to experience increased dyspnea, cough, and/or sputum production after completing treatment, please call and scheduled an appointment.   Please continue to a heart-healthy diet and increase your physical activities. Try to exercise for at least five days a week.    It was a pleasure to see you and I look forward to continuing to work together on your health and well-being. Please do not hesitate to call the office if you need care or have questions about your care.  In case of emergency, please visit the Emergency Department for urgent care, or contact our clinic at [phone] to schedule an appointment. We're here to help you!   Have a wonderful day and week. With Gratitude, Gilmore Laroche MSN, FNP-BC

## 2023-02-05 NOTE — Progress Notes (Signed)
Established Patient Office Visit  Subjective:  Patient ID: Jerry Schaefer, male    DOB: Sep 07, 1965  Age: 57 y.o. MRN: [id number]  CC:  Chief Complaint  Patient presents with   Chronic Care Management    3 month f/u, pt reports an ED visit on 07/08 was d/c 07/09 due to copd flare up. Reports feeling winded still.     HPI Jerry Schaefer is a 57 y.o. male presents for  ED f/u  on 02/03/23. For the details of today's visit, please refer to the assessment and plan.   Past Medical History:  Diagnosis Date   COPD (chronic obstructive pulmonary disease) (HCC)     History reviewed. No pertinent surgical history.  History reviewed. No pertinent family history.  Social History   Socioeconomic History   Marital status: Married    Spouse name: Not on file   Number of children: Not on file   Years of education: Not on file   Highest education level: Not on file  Occupational History   Not on file  Tobacco Use   Smoking status: Some Days    Packs/day: 1    Types: Cigarettes   Smokeless tobacco: Never   Tobacco comments:    Cutting back on smoking  Substance and Sexual Activity   Alcohol use: No   Drug use: No   Sexual activity: Not on file  Other Topics Concern   Not on file  Social History Narrative   Not on file   Social Determinants of Health   Financial Resource Strain: Not on file  Food Insecurity: Not on file  Transportation Needs: Not on file  Physical Activity: Not on file  Stress: Not on file  Social Connections: Not on file  Intimate Partner Violence: Not on file    Outpatient Medications Prior to Visit  Medication Sig Dispense Refill   albuterol (VENTOLIN HFA) 108 (90 Base) MCG/ACT inhaler INHALE 1-2 PUFFS INTO THE LUNGS EVERY 4-6 HOURS AS NEEDED FOR WHEEZING OR SHORTNESS OF BREATH 8.5 g 2   Budeson-Glycopyrrol-Formoterol (BREZTRI AEROSPHERE) 160-9-4.8 MCG/ACT AERO Take 2 puffs first thing in am and then another 2 puffs about 12 hours later. 10.7 g 11    doxycycline (VIBRAMYCIN) 100 MG capsule Take 1 capsule (100 mg total) by mouth 2 (two) times daily. 14 capsule 0   fluticasone (FLONASE) 50 MCG/ACT nasal spray Place 2 sprays into both nostrils daily. 16 g 6   predniSONE (DELTASONE) 50 MG tablet Take 1 tablet (50 mg total) by mouth daily with breakfast. 1 tablet PO QD X4 days 5 tablet 0   tamsulosin (FLOMAX) 0.4 MG CAPS capsule TAKE ONE CAPSULE BY MOUTH EVERY DAY 30 capsule 3   Vitamin D, Ergocalciferol, (DRISDOL) 1.25 MG (50000 UNIT) CAPS capsule TAKE ONE CAPSULE BY MOUTH EVERY 7 DAYS 5 capsule 3   albuterol (PROVENTIL) (2.5 MG/3ML) 0.083% nebulizer solution Take 3 mLs (2.5 mg total) by nebulization every 4 (four) hours as needed for wheezing or shortness of breath. 75 mL 12   levocetirizine (XYZAL ALLERGY 24HR) 5 MG tablet Take 1 tablet (5 mg total) by mouth every evening. (Patient not taking: Reported on 02/05/2023) 30 tablet 2   No facility-administered medications prior to visit.    No Known Allergies  ROS Review of Systems  Constitutional:  Negative for fatigue and fever.  Eyes:  Negative for visual disturbance.  Respiratory:  Negative for cough, chest tightness and shortness of breath.   Cardiovascular:  Negative for chest pain and  palpitations.  Neurological:  Negative for dizziness and headaches.      Objective:    Physical Exam HENT:     Head: Normocephalic.     Right Ear: External ear normal.     Left Ear: External ear normal.     Nose: No congestion or rhinorrhea.     Mouth/Throat:     Mouth: Mucous membranes are moist.  Cardiovascular:     Rate and Rhythm: Regular rhythm.     Heart sounds: No murmur heard. Pulmonary:     Effort: No respiratory distress.     Breath sounds: Normal breath sounds.  Neurological:     Mental Status: He is alert.     BP 115/66   Pulse (!) 101   Ht 5\' 9"  (1.753 m)   Wt 108 lb 0.6 oz (49 kg)   SpO2 (!) 87%   BMI 15.95 kg/m  Wt Readings from Last 3 Encounters:  02/05/23 108 lb  0.6 oz (49 kg)  02/04/23 115 lb (52.2 kg)  01/24/23 109 lb 3.2 oz (49.5 kg)    Lab Results  Component Value Date   TSH 1.380 10/24/2022   Lab Results  Component Value Date   WBC 22.2 (H) 02/04/2023   HGB 14.1 02/04/2023   HCT 42.7 02/04/2023   MCV 98.4 02/04/2023   PLT 235 02/04/2023   Lab Results  Component Value Date   NA 136 02/04/2023   K 3.9 02/04/2023   CO2 27 02/04/2023   GLUCOSE 120 (H) 02/04/2023   BUN 18 02/04/2023   CREATININE 0.86 02/04/2023   BILITOT 0.2 10/24/2022   ALKPHOS 74 10/24/2022   AST 13 10/24/2022   ALT 15 10/24/2022   PROT 6.5 10/24/2022   ALBUMIN 3.8 10/24/2022   CALCIUM 8.5 (L) 02/04/2023   ANIONGAP 8 02/04/2023   EGFR 105 10/24/2022   Lab Results  Component Value Date   CHOL 125 10/24/2022   Lab Results  Component Value Date   HDL 40 10/24/2022   Lab Results  Component Value Date   LDLCALC 72 10/24/2022   Lab Results  Component Value Date   TRIG 60 10/24/2022   Lab Results  Component Value Date   CHOLHDL 3.1 10/24/2022   Lab Results  Component Value Date   HGBA1C 6.0 (H) 10/24/2022      Assessment & Plan:  COPD with acute exacerbation (HCC) -     Albuterol Sulfate; Take 3 mLs (2.5 mg total) by nebulization every 4 (four) hours as needed for wheezing or shortness of breath.  Dispense: 75 mL; Refill: 12   Note: This chart has been completed using Engineer, civil (consulting) software, and while attempts have been made to ensure accuracy, certain words and phrases may not be transcribed as intended.   Follow-up: No follow-ups on file.   Gilmore Laroche, FNP

## 2023-02-25 ENCOUNTER — Ambulatory Visit: Payer: BC Managed Care – PPO | Admitting: Internal Medicine

## 2023-03-07 ENCOUNTER — Telehealth: Payer: Self-pay | Admitting: Internal Medicine

## 2023-03-07 NOTE — Telephone Encounter (Signed)
Pt. Was prescribed for Brestriz and wants to know if can get coupon for the meds or if he could get samples or try something more affordable please advise

## 2023-03-07 NOTE — Telephone Encounter (Signed)
Patient advised there is a coupon program that may work with his insurance. Copy left at front desk for him to pick up. Nothing further needed at this time.

## 2023-04-17 ENCOUNTER — Other Ambulatory Visit: Payer: Self-pay | Admitting: Family Medicine

## 2023-04-17 DIAGNOSIS — J301 Allergic rhinitis due to pollen: Secondary | ICD-10-CM

## 2023-04-25 ENCOUNTER — Ambulatory Visit (INDEPENDENT_AMBULATORY_CARE_PROVIDER_SITE_OTHER): Payer: BC Managed Care – PPO | Admitting: Internal Medicine

## 2023-04-25 ENCOUNTER — Encounter: Payer: Self-pay | Admitting: Internal Medicine

## 2023-04-25 VITALS — BP 130/77 | HR 78 | Ht 69.0 in | Wt 109.0 lb

## 2023-04-25 DIAGNOSIS — J449 Chronic obstructive pulmonary disease, unspecified: Secondary | ICD-10-CM | POA: Diagnosis not present

## 2023-04-25 DIAGNOSIS — F17210 Nicotine dependence, cigarettes, uncomplicated: Secondary | ICD-10-CM | POA: Diagnosis not present

## 2023-04-25 DIAGNOSIS — R918 Other nonspecific abnormal finding of lung field: Secondary | ICD-10-CM | POA: Diagnosis not present

## 2023-04-25 MED ORDER — PREDNISONE 10 MG PO TABS: ORAL_TABLET | ORAL | 2 refills | Status: DC

## 2023-04-25 NOTE — Progress Notes (Signed)
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 [name] for COPD   Dyspnea:  walking at [employer] is a struggle  Cough: better p aecopd rx with doxy Sleep: level bed/ 2 pillows  SABA use: 2 puffs x 3 per day and neb up to bid  Rec Plan A = Automatic = Always=    Trelegy 100 one click take 2 good drags each am then rinse and gargle Plan B = Backup (to supplement plan A, not to replace it) Only use your albuterol inhaler as a rescue medication Plan C = Crisis (instead of Plan B but only if Plan B stops working) - only use your albuterol nebulizer if you first try Plan B and it fails to help  Ok to try albuterol 15 min before an activity (on alternating days)  that you know would usually make you short of breath  -  Allergy screen  12/25/21  >  Eos 0.4 - Labs ordered 02/11/2022  :    alpha one AT phenotype  MM level 177  - PFT's  03/26/22  FEV1 1.32  (36 % ) ratio 0.38  p 10 % improvement from saba p Trelegy prior to study with DLCO  9 (32%)  FV curve classically concave  - LDCT 03/06/22 RADS 2 MPN all < 7.2 mm     01/24/2023  f/u ov/[place] office/Evelin Cake re: GOLD 3 copd maint on trelegy 200   Chief Complaint  Patient presents with   Follow-up  Dyspnea:  fast still fine flat fast  Cough: min clear more daytime  Sleeping: flat/ 2 pillows  SABA use: 6 x daily  02: none  Lung cancer screening: in program  rec  Plan A = Automatic = Always=    Breztri Take 2 puffs first thing in am and then another 2  puffs about 12 hours later.  Work on inhaler technique:   Plan B = Backup (to supplement plan A, not to replace it) Only use your albuterol inhaler as a rescue medication Plan C = if still needing a lot of Plan B > Prednisone 10 mg take  4 each am x 2 days,   2 each am x 2 days,  1 each am x 2 days and stop  For cough > mucinex dm take up to 1200mg  every 12hours as neede The key is to stop smoking completely before smoking completely stops you!      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    Also denies any obvious fluctuation of symptoms with weather or environmental changes or other aggravating or alleviating factors except as outlined above   No unusual exposure hx or h/o childhood pna/ asthma or knowledge of premature birth.  Current Allergies, Complete Past Medical History, Past Surgical History, Family History, and Social History were reviewed in [name].  ROS  The following are not active complaints unless bolded Hoarseness, sore throat, dysphagia, dental problems, itching, sneezing,  nasal congestion or discharge of excess mucus or purulent secretions, ear ache,   fever, chills, sweats, unintended wt loss or wt gain, classically pleuritic or exertional cp,  orthopnea pnd or arm/hand swelling  or leg swelling, presyncope, palpitations, abdominal pain, anorexia, nausea, vomiting, diarrhea  or change in bowel habits or change in bladder habits, change in stools or  change in urine, dysuria, hematuria,  rash, arthralgias, visual complaints, headache, numbness, weakness or ataxia or problems with walking or coordination,  change in mood or  memory.        Current Meds  Medication Sig   albuterol (PROVENTIL) (2.5 MG/3ML) 0.083% nebulizer solution Take 3 mLs (2.5 mg total) by nebulization every 4 (four) hours as needed for wheezing or shortness of breath.   albuterol (VENTOLIN HFA) 108 (90 Base) MCG/ACT inhaler INHALE 1-2 PUFFS INTO THE LUNGS EVERY 4-6 HOURS AS NEEDED FOR WHEEZING OR SHORTNESS OF BREATH   Budeson-Glycopyrrol-Formoterol (BREZTRI AEROSPHERE) 160-9-4.8 MCG/ACT AERO Take 2 puffs first thing in am and then another 2 puffs about 12 hours later.   fluticasone (FLONASE) 50 MCG/ACT nasal spray Place 2 sprays into both nostrils daily.   levocetirizine (XYZAL) 5 MG tablet TAKE 1 TABLET BY MOUTH EVERY EVENING   predniSONE (DELTASONE) 10 MG tablet 2 each am until better then on daily x 5 days and stop   tamsulosin (FLOMAX) 0.4 MG CAPS capsule TAKE ONE CAPSULE BY MOUTH EVERY DAY   Vitamin D, Ergocalciferol, (DRISDOL) 1.25 MG (50000 UNIT) CAPS capsule TAKE ONE CAPSULE BY MOUTH EVERY 7 DAYS   [DISCONTINUED] TRELEGY ELLIPTA 200-62.5-25 MCG/ACT AEPB Inhale 1 puff into the lungs daily.         Objective:    Wts  04/25/2023       109   01/24/2023       109   08/29/2022         120   05/20/22 118 lb 12.8 oz (53.9 kg)  03/27/22 121 lb 1.9 oz (54.9 kg)  02/11/22 119 lb 6.4 oz (54.2 kg)    Vital signs reviewed  04/25/2023  - Note at rest 02 sats  88% on RA   General appearance:    thin chronically ill appearing wm nad     HEENT :  Oropharynx  clear      NECK :  without JVD/Nodes/TM/ nl carotid upstrokes bilaterally   LUNGS: no acc muscle use,  Mod barrel  contour chest wall with bilateral  Distant exp wheeze and  without cough on insp or exp maneuvers and mod  Hyperresonant  to  percussion bilaterally     CV:  RRR  no s3 or murmur or increase in P2,  and no edema   ABD:  soft and nontender with pos mid insp Hoover's  in the supine position. No bruits or organomegaly  appreciated, bowel sounds nl  MS:   Ext warm without deformities or   obvious joint restrictions , calf tenderness, cyanosis or clubbing  SKIN: warm and dry without lesions    NEURO:  alert, approp, nl sensorium with  no motor or cerebellar deficits apparent.           Assessment

## 2023-04-25 NOTE — Assessment & Plan Note (Signed)
LDSCT chest  03/06/22  1. Lung-RADS 3, probably benign findings. Short-term follow-up in 6 months is recommended with repeat low-dose chest CT without contrast (please use the following order, "CT CHEST LCS NODULE FOLLOW-UP W/O CM"). Multiple pulmonary nodules of maximally volume derived equivalent diameter 7.4 mm>> repeat 09/25/22  wax/ wane pattern  Rec resume LCS program  Discussed in detail all the  indications, usual  risks and alternatives  relative to the benefits with patient who agrees to proceed with w/u as outlined.

## 2023-04-25 NOTE — Patient Instructions (Addendum)
Plan A = Automatic = Always=    Breztri Take 2 puffs first thing in am and then another 2 puffs about 12 hours later.    Work on inhaler technique:  relax and gently blow all the way out then take a nice smooth full deep breath back in, triggering the inhaler at same time you start breathing in.  Hold breath in for at least  5 seconds if you can. Blow out breztri  thru nose. Rinse and gargle with water when done.  If mouth or throat bother you at all,  try brushing teeth/gums/tongue with arm and hammer toothpaste/ make a slurry and gargle and spit out.   Plan B = Backup (to supplement plan A, not to replace it) Only use your albuterol inhaler as a rescue medication to be used if you can't catch your breath by resting or doing a relaxed purse lip breathing pattern.  - The less you use it, the better it will work when you need it. - Ok to use the inhaler up to 2 puffs  every 4 hours if you must but call for appointment if use goes up over your usual need - Don't leave home without it !!  (think of it like the spare tire for your car)   Plan C = Crisis (instead of Plan B but only if Plan B stops working) - only use your albuterol nebulizer if you first try Plan B and it fails to help > ok to use the nebulizer up to every 4 hours but if start needing it regularly call for immediate appointment  Plan D = Deltasone   - when ABC not working well prednisone  10 mg x 2 each am until better then on daily x 5 days and stop   For cough/ congestion :  mucinex dm 1200 mg every 12 hours as needed    Please schedule a follow up office visit in 6 weeks, call sooner if needed with all medications /inhalers/ solutions in hand so we can verify exactly what you are taking. This includes all medications from all doctors and over the counters

## 2023-04-25 NOTE — Assessment & Plan Note (Signed)
Active smoker -  Allergy screen  12/25/21  >  Eos 0.4 - Labs ordered 02/11/2022  :    alpha one AT phenotype  MM level 177  - 02/11/2022    try trelegy 100 one click each am  - PFT's  03/26/22  FEV1 1.32  (36 % ) ratio 0.38  p 10 % improvement from saba p Trelegy prior to study with DLCO  9 (32%)  FV curve classically concave  - 05/20/2022  After extensive coaching inhaler device,  effectiveness =    90% with hfa > changed to breztri due to hoarseness on Trelegy > preferred  Trelegy 08/29/2022 so ok to resume - 01/24/2023   too much saba hfa while on trelegy 200 so change to breztri again with approp saba this time and Predisone x 6 day cycles as Plan C > 04/25/2023 did not follow ABC plan - 04/25/2023  After extensive coaching inhaler device,  effectiveness =    60% from a basline of 30%  (short Ti).  ABCD plan :  20 mg daily until better then 1 daily x 5 days only    Group D (now reclassified as E) in terms of symptom/risk and laba/lama/ICS  therefore appropriate rx at this point >>>  breztri plus pred as a back up and much more approp use of saba:  Re SABA :  I spent extra time with pt today reviewing appropriate use of albuterol for prn use on exertion with the following points: 1) saba is for relief of sob that does not improve by walking a slower pace or resting but rather if the pt does not improve after trying this first. 2) If the pt is convinced, as many are, that saba helps recover from activity faster then it's easy to tell if this is the case by re-challenging : ie stop, take the inhaler, then p 5 minutes try the exact same activity (intensity of workload) that just caused the symptoms and see if they are substantially diminished or not after saba 3) if there is an activity that reproducibly causes the symptoms, try the saba 15 min before the activity on alternate days   If in fact the saba really does help, then fine to continue to use it prn but advised may need to look closer at the  maintenance regimen being used to achieve better control of airways disease with exertion.

## 2023-04-25 NOTE — Assessment & Plan Note (Addendum)
Counseled re importance of smoking cessation but did not meet time criteria for separate billing     F/u 6 weeks with all meds in hand using a trust but verify approach to confirm accurate Medication  Reconciliation The principal here is that until we are certain that the  patients are doing what we've asked, it makes no sense to ask them to do more.   Each maintenance medication was reviewed in detail including emphasizing most importantly the difference between maintenance and prns and under what circumstances the prns are to be triggered using an action plan format where appropriate.  Total time for H and P, chart review, counseling, reviewing hfa/neb device(s) and generating customized AVS unique to this office visit / same day charting  > 40 min for  efractory respiratory  symptoms  and high risk of escalation of care needs if just keeps doing what he's doing

## 2023-05-14 ENCOUNTER — Ambulatory Visit: Payer: BC Managed Care – PPO | Admitting: Family Medicine

## 2023-06-01 NOTE — Progress Notes (Unsigned)
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 [name] for COPD   Dyspnea:  walking at [employer] is a struggle  Cough: better p aecopd rx with doxy Sleep: level bed/ 2 pillows  SABA use: 2 puffs x 3 per day and neb up to bid  Rec Plan A = Automatic = Always=    Trelegy 100 one click take 2 good drags each am then rinse and gargle Plan B = Backup (to supplement plan A, not to replace it) Only use your albuterol inhaler as a rescue medication Plan C = Crisis (instead of Plan B but only if Plan B stops working) - only use your albuterol nebulizer if you first try Plan B and it fails to help  Ok to try albuterol 15 min before an activity (on alternating days)  that you know would usually make you short of breath  -  Allergy screen  12/25/21  >  Eos 0.4 - Labs ordered 02/11/2022  :    alpha one AT phenotype  MM level 177  - PFT's  03/26/22  FEV1 1.32  (36 % ) ratio 0.38  p 10 % improvement from saba p Trelegy prior to study with DLCO  9 (32%)  FV curve classically concave  - LDCT 03/06/22 RADS 2 MPN all < 7.2 mm     04/25/2023  post ER f/u ov/[name]/Jerry Schaefer re: GOLD 3 copd maint on breztri  and still smoking  Chief Complaint  Patient presents with   COPD    Gold 3  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  Work on inhaler technique:   Plan B = Backup (to supplement plan A, not to replace it) Only use your albuterol inhaler as a rescue medication Plan C = Crisis (instead of Plan B but only if Plan B stops working) - only use your albuterol nebulizer if you first try Plan B  Plan D = Deltasone   - when ABC not working well prednisone  10 mg x 2 each am until better then on daily x 5 days and stop  For cough/ congestion :  mucinex dm 1200 mg every 12 hours as needed  Please schedule a follow up office visit in 6 weeks, call sooner if needed with all medications /inhalers/ solutions in hand   06/02/2023  f/u ov/[name]/Jerry Schaefer re: GOLD 3 copd  maint on breztri   did  bring inhalers/ not on 02 but still smoking  Chief Complaint  Patient presents with  COPD GOLD 3  Dyspnea:  [employer] has to stop every other aisle/ uses [employer]  Cough: mucinex helping  Sleeping: flat bed 2 pillows s    resp cc  SABA use: way too much  02: none   Lung cancer screening: due q feb    No obvious day to day or daytime variability or assoc excess/ purulent sputum or mucus plugs or hemoptysis or cp or chest tightness, subjective wheeze or overt sinus or hb symptoms.    Also denies any obvious fluctuation of symptoms with weather or environmental changes or other aggravating or alleviating factors except as outlined above   No unusual exposure hx or h/o childhood pna/ asthma or knowledge of premature birth.  Current Allergies, Complete Past Medical History, Past Surgical History, Family History, and Social History were reviewed in [name].  ROS  The following are not active complaints unless bolded Hoarseness, sore throat, dysphagia, dental problems, itching, sneezing,  nasal congestion or discharge of excess mucus or purulent secretions, ear ache,   fever, chills, sweats, unintended wt  loss or wt gain, classically pleuritic or exertional cp,  orthopnea pnd or arm/hand swelling  or leg swelling, presyncope, palpitations, abdominal pain, anorexia, nausea, vomiting, diarrhea  or change in bowel habits or change in bladder habits, change in stools or change in urine, dysuria, hematuria,  rash, arthralgias, visual complaints, headache, numbness, weakness or ataxia or problems with walking or coordination,  change in mood or  memory.        Current Meds  Medication Sig   albuterol (PROVENTIL) (2.5 MG/3ML) 0.083% nebulizer solution Take 3 mLs (2.5 mg total) by nebulization every 4 (four) hours as needed for wheezing or shortness of breath.   albuterol (VENTOLIN HFA) 108 (90 Base) MCG/ACT inhaler INHALE 1-2 PUFFS INTO THE LUNGS EVERY 4-6 HOURS AS NEEDED FOR WHEEZING OR SHORTNESS OF BREATH   Budeson-Glycopyrrol-Formoterol (BREZTRI AEROSPHERE) 160-9-4.8 MCG/ACT AERO Take 2 puffs first thing in am and then another 2 puffs about 12 hours later.   fluticasone (FLONASE) 50 MCG/ACT nasal spray Place 2 sprays into both nostrils daily.   levocetirizine (XYZAL) 5 MG tablet TAKE 1 TABLET BY MOUTH EVERY EVENING   predniSONE (DELTASONE) 10 MG tablet 2 each am until better then on daily x 5 days and stop   tamsulosin (FLOMAX) 0.4 MG CAPS capsule TAKE ONE CAPSULE BY MOUTH EVERY DAY   Vitamin D, Ergocalciferol, (DRISDOL) 1.25 MG (50000 UNIT) CAPS capsule TAKE ONE CAPSULE BY MOUTH EVERY 7 DAYS         Objective:    Wts  06/02/2023       113  04/25/2023       109   01/24/2023       109   08/29/2022         120   05/20/22 118 lb 12.8 oz (53.9 kg)  03/27/22 121 lb 1.9 oz (54.9 kg)  02/11/22 119 lb 6.4 oz (54.2 kg)    Vital signs reviewed  06/02/2023  - Note at rest 02 sats  79% on RA   General appearance:    chronically ill thin amb wm nad    HEENT :  Oropharynx  clear       NECK :  without JVD/Nodes/TM/ nl carotid upstrokes bilaterally   LUNGS: no acc muscle use,  Mod barrel  contour chest  wall with bilateral  Distant rhonchi  and  without cough on insp or exp maneuvers and mod  Hyperresonant  to  percussion bilaterally     CV:  RRR  no s3 or murmur or increase in P2, and no edema   ABD:  soft and nontender with pos mid insp Hoover's  in the supine position. No bruits or organomegaly appreciated, bowel sounds nl  MS:   Ext warm without deformities or   obvious joint restrictions , calf tenderness, cyanosis or clubbing  SKIN: warm and dry without lesions    NEURO:  alert, approp, nl sensorium with  no motor or cerebellar deficits apparent.          Assessment

## 2023-06-02 ENCOUNTER — Encounter: Payer: Self-pay | Admitting: Internal Medicine

## 2023-06-02 ENCOUNTER — Ambulatory Visit (INDEPENDENT_AMBULATORY_CARE_PROVIDER_SITE_OTHER): Payer: BC Managed Care – PPO | Admitting: Internal Medicine

## 2023-06-02 VITALS — BP 122/76 | HR 98 | Ht 69.0 in | Wt 113.0 lb

## 2023-06-02 DIAGNOSIS — F17210 Nicotine dependence, cigarettes, uncomplicated: Secondary | ICD-10-CM | POA: Diagnosis not present

## 2023-06-02 DIAGNOSIS — J449 Chronic obstructive pulmonary disease, unspecified: Secondary | ICD-10-CM | POA: Diagnosis not present

## 2023-06-02 DIAGNOSIS — J9611 Chronic respiratory failure with hypoxia: Secondary | ICD-10-CM | POA: Insufficient documentation

## 2023-06-02 MED ORDER — PREDNISONE 10 MG PO TABS: ORAL_TABLET | ORAL | 2 refills | Status: DC

## 2023-06-02 NOTE — Patient Instructions (Addendum)
We will be referring your for 24 hour oxygen starting with 2lpm at bedtime and make sure you check your oxygen saturation  AT  your highest level of activity (not after you stop)   to be sure it stays over 90% and adjust  02 flow upward to maintain this level if needed but remember to turn it back to previous settings when you stop (to conserve your supply).   Work on inhaler technique:  relax and gently blow all the way out then take a nice smooth full deep breath back in, triggering the inhaler at same time you start breathing in.  Hold breath in for at least  5 seconds if you can. Blow out breztri thru nose. Rinse and gargle with water when done.  If mouth or throat bother you at all,  try brushing teeth/gums/tongue with arm and hammer toothpaste/ make a slurry and gargle and spit out.   >>>  Remember how golfers warm up by taking practice swings - do this with an empty inhaler    Plan D = Deltasone   - when ABC not working well prednisone  10 mg x 2 each am until better then one daily x 5 days and stop     Cannot smoke with 02 in the same room  We will start you on 02 2lpm at bedtime and at rest  and 4lpm with walking   Please schedule a follow up office visit in 6 weeks, call sooner if needed - bring your portable system with you and your inhalers

## 2023-06-02 NOTE — Assessment & Plan Note (Signed)
4-5 min discussion re active cigarette smoking in addition to office E&M  Ask about tobacco use:   ongoing  Advise quitting   advised at this point frankly dangerous to smoke and expose himself to higher concentration of oxygen compared to room air.  Assess willingness:  Not committed at this point Assist in quit attempt:  Per PCP when ready Arrange follow up:   Follow up per Primary Care planned

## 2023-06-02 NOTE — Assessment & Plan Note (Signed)
Active smoker -  Allergy screen  12/25/21  >  Eos 0.4 - Labs ordered 02/11/2022  :    alpha one AT phenotype  MM level 177  - 02/11/2022    try trelegy 100 one click each am  - PFT's  03/26/22  FEV1 1.32  (36 % ) ratio 0.38  p 10 % improvement from saba p Trelegy prior to study with DLCO  9 (32%)  FV curve classically concave  - 05/20/2022  After extensive coaching inhaler device,  effectiveness =    90% with hfa > changed to breztri due to hoarseness on Trelegy > preferred  Trelegy 08/29/2022 so ok to resume - 01/24/2023   too much saba hfa while on trelegy 200 so change to breztri again with approp saba this time and Predisone x 6 day cycles as Plan C > 04/25/2023 did not follow ABC plan - 04/25/2023  After extensive coaching inhaler device,  effectiveness =    60% from a basline of 30%  (short Ti).  ABCD plan : A is Breztri and D -= 20 mg daily until better then 1 daily x 5 days only   06/02/2023  After extensive coaching inhaler device,  effectiveness =    75% (short Ti, late trigger) > continue breztri and approp saba and prednsione taper as plan D   Re SABA :  I spent extra time with pt today reviewing appropriate use of albuterol for prn use on exertion with the following points: 1) saba is for relief of sob that does not improve by walking a slower pace or resting but rather if the pt does not improve after trying this first. 2) If the pt is convinced, as many are, that saba helps recover from activity faster then it's easy to tell if this is the case by re-challenging : ie stop, take the inhaler, then p 5 minutes try the exact same activity (intensity of workload) that just caused the symptoms and see if they are substantially diminished or not after saba 3) if there is an activity that reproducibly causes the symptoms, try the saba 15 min before the activity on alternate days   If in fact the saba really does help, then fine to continue to use it prn but advised may need to look closer at the  maintenance regimen being used to achieve better control of airways disease with exertion.

## 2023-06-02 NOTE — Assessment & Plan Note (Addendum)
06/02/2023  sats 79% RA then required 2lpm for sats 91% at rest  then patient walked at moderate pace for 3 laps, desat to 81% on 3L, placed on 4L for last lap and sats > 90% - 06/02/2023 rec 2lpm at rest and up to 4lpm cont walking    Each maintenance medication was reviewed in detail including emphasizing most importantly the difference between maintenance and prns and under what circumstances the prns are to be triggered using an action plan format where appropriate.  Total time for H and P, chart review, counseling, reviewing hfa/neb/02/pulse ox  device(s) , directly observing portions of ambulatory 02 saturation study/ and generating customized AVS unique to this office visit / same day charting > 30 min

## 2023-06-03 DIAGNOSIS — J449 Chronic obstructive pulmonary disease, unspecified: Secondary | ICD-10-CM | POA: Diagnosis not present

## 2023-06-08 ENCOUNTER — Other Ambulatory Visit: Payer: Self-pay | Admitting: Family Medicine

## 2023-06-08 DIAGNOSIS — R399 Unspecified symptoms and signs involving the genitourinary system: Secondary | ICD-10-CM

## 2023-06-23 ENCOUNTER — Ambulatory Visit: Payer: BC Managed Care – PPO | Admitting: Family Medicine

## 2023-07-08 ENCOUNTER — Other Ambulatory Visit: Payer: Self-pay | Admitting: Family Medicine

## 2023-07-08 DIAGNOSIS — J301 Allergic rhinitis due to pollen: Secondary | ICD-10-CM

## 2023-07-13 NOTE — Progress Notes (Unsigned)
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 Jerry Schaefer for COPD   Dyspnea:  walking at [employer] is a struggle  Cough: better p aecopd rx with doxy Sleep: level bed/ 2 pillows  SABA use: 2 puffs x 3 per day and neb up to bid  Rec Plan A = Automatic = Always=    Trelegy 100 one click take 2 good drags each am then rinse and gargle Plan B = Backup (to supplement plan A, not to replace it) Only use your albuterol inhaler as a rescue medication Plan C = Crisis (instead of Plan B but only if Plan B stops working) - only use your albuterol nebulizer if you first try Plan B and it fails to help  Ok to try albuterol 15 min before an activity (on alternating days)  that you know would usually make you short of breath  -  Allergy screen  12/25/21  >  Eos 0.4 - Labs ordered 02/11/2022  :    alpha one AT phenotype  MM level 177  - PFT's  03/26/22  FEV1 1.32  (36 % ) ratio 0.38  p 10 % improvement from saba p Trelegy prior to study with DLCO  9 (32%)  FV curve classically concave  - LDCT 03/06/22 RADS 2 MPN all < 7.2 mm     04/25/2023  post ER f/u ov/[facility] office/Jerry Schaefer re: GOLD 3 copd maint on breztri  and still smoking  Chief Complaint  Patient presents with   COPD    Gold 3  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  Work on inhaler technique:   Plan B = Backup (to supplement plan A, not to replace it) Only use your albuterol inhaler as a rescue medication Plan C = Crisis (instead of Plan B but only if Plan B stops working) - only use your albuterol nebulizer if you first try Plan B  Plan D = Deltasone   - when ABC not working well prednisone  10 mg x 2 each am until better then on daily x 5 days and stop  For cough/ congestion :  mucinex dm 1200 mg every 12 hours as needed  Please schedule a follow up office visit in 6 weeks, call sooner if needed with all medications /inhalers/ solutions in hand   06/02/2023  f/u ov/[facility] office/Jerry Schaefer re: GOLD 3 copd  maint on breztri   did  bring inhalers/ not on 02 but still smoking  Chief Complaint  Patient presents with  COPD GOLD 3  Dyspnea:  [employer] has to stop every other aisle/ uses [employer]   Cough: mucinex helping  Sleeping: flat bed 2 pillows s    resp cc  SABA use: way too much  02: none  Rec We will be referring your for 24 hour oxygen starting with 2lpm at bedtime and make sure you check your oxygen saturation  AT  your highest level of activity (not after you stop)   to be sure it stays over 90% and adjust  02 flow upward to maintain this level if needed but remember to turn it back to previous settings when you stop (to conserve your supply).  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 - do this with an empty inhaler  Plan D = Deltasone   - when ABC not working well prednisone  10 mg x 2 each am until better then one daily x 5 days and stop    Cannot smoke with 02 in the same room We will start you on 02 2lpm at bedtime and at rest  and 4lpm with walking  Please schedule a follow up office visit in 6 weeks, call sooner if needed - bring your portable system with you and your inhalers     07/14/2023  f/u  ov/[facility] office/Jerry Schaefer re: GOLD 2 copd  maint on Breztri / stills smoking   Chief Complaint  Patient presents with   Follow-up    Follow up   Dyspnea:  no change / not wearing 02 as rec  Cough: none  Sleeping: flat bed 2 pillows s    resp cc  SABA use: no neb / no hfa / no pred x one week ran out  02: 2lpm at hs / prn   Lung cancer screening: due every Feb    No obvious day to day or daytime variability or assoc excess/ purulent sputum or mucus plugs or hemoptysis or cp or chest tightness, subjective wheeze or overt   hb symptoms.    Also denies any obvious fluctuation of symptoms with weather or environmental changes or other aggravating or alleviating factors except as outlined above   No unusual exposure hx or h/o childhood pna/ asthma or knowledge of premature birth.  Current Allergies, Complete Past Medical History, Past Surgical History, Family History, and Social History were reviewed in [name].  ROS  The following are not active complaints unless bolded Hoarseness, sore throat, dysphagia, dental problems, itching, sneezing,  nasal congestion or discharge of excess mucus or purulent secretions, ear ache,   fever, chills, sweats, unintended wt loss or wt gain, classically pleuritic or exertional cp,  orthopnea pnd or arm/hand swelling  or leg swelling, presyncope, palpitations, abdominal pain, anorexia, nausea, vomiting, diarrhea  or change in bowel habits or change in bladder habits, change in stools or change in urine, dysuria, hematuria,  rash, arthralgias, visual complaints, headache, numbness, weakness or ataxia or problems with walking or coordination,  change in mood or  memory.        Current Meds  Medication Sig   albuterol (PROVENTIL) (2.5 MG/3ML) 0.083% nebulizer solution Take 3 mLs (2.5 mg total) by nebulization every 4 (four) hours as needed for wheezing or shortness of breath.   albuterol (VENTOLIN HFA) 108 (90 Base) MCG/ACT  inhaler INHALE 1-2 PUFFS INTO THE LUNGS EVERY 4-6 HOURS AS NEEDED FOR WHEEZING OR SHORTNESS OF BREATH   Budeson-Glycopyrrol-Formoterol (BREZTRI AEROSPHERE)  160-9-4.8 MCG/ACT AERO Take 2 puffs first thing in am and then another 2 puffs about 12 hours later.   tamsulosin (FLOMAX) 0.4 MG CAPS capsule TAKE ONE CAPSULE BY MOUTH EVERY DAY   Vitamin D, Ergocalciferol, (DRISDOL) 1.25 MG (50000 UNIT) CAPS capsule TAKE ONE CAPSULE BY MOUTH EVERY 7 DAYS            Objective:    Wts   07/14/2023     119   06/02/2023       113  04/25/2023       109   01/24/2023       109   08/29/2022         120   05/20/22 118 lb 12.8 oz (53.9 kg)  03/27/22 121 lb 1.9 oz (54.9 kg)  02/11/22 119 lb 6.4 oz (54.2 kg)     HEENT :  Oropharynx  clear   Nasal turbinates nl    NECK :  without JVD/Nodes/TM/ nl carotid upstrokes bilaterally   LUNGS: no acc muscle use,  Mod barrel  contour chest wall with bilateral  Distant bs s audible wheeze and  without cough on insp or exp maneuvers and mod  Hyperresonant  to  percussion bilaterally     CV:  RRR  no s3 or murmur or increase in P2, and no edema   ABD:  soft and nontender with pos mid insp Hoover's  in the supine position. No bruits or organomegaly appreciated, bowel sounds nl  MS:   Ext warm without deformities or   obvious joint restrictions , calf tenderness, cyanosis or clubbing  SKIN: warm and dry without lesions    NEURO:  alert, approp, nl sensorium with  no motor or cerebellar deficits apparent.         Assessment

## 2023-07-14 ENCOUNTER — Encounter: Payer: Self-pay | Admitting: Internal Medicine

## 2023-07-14 ENCOUNTER — Ambulatory Visit: Payer: BC Managed Care – PPO | Admitting: Internal Medicine

## 2023-07-14 VITALS — BP 132/76 | HR 83 | Ht 69.0 in | Wt 119.0 lb

## 2023-07-14 DIAGNOSIS — J9611 Chronic respiratory failure with hypoxia: Secondary | ICD-10-CM | POA: Diagnosis not present

## 2023-07-14 DIAGNOSIS — J441 Chronic obstructive pulmonary disease with (acute) exacerbation: Secondary | ICD-10-CM | POA: Diagnosis not present

## 2023-07-14 DIAGNOSIS — F17210 Nicotine dependence, cigarettes, uncomplicated: Secondary | ICD-10-CM | POA: Diagnosis not present

## 2023-07-14 DIAGNOSIS — J449 Chronic obstructive pulmonary disease, unspecified: Secondary | ICD-10-CM

## 2023-07-14 MED ORDER — ALBUTEROL SULFATE HFA 108 (90 BASE) MCG/ACT IN AERS: INHALATION_SPRAY | RESPIRATORY_TRACT | 2 refills | Status: DC

## 2023-07-14 MED ORDER — PREDNISONE 10 MG PO TABS: ORAL_TABLET | ORAL | 2 refills | Status: DC

## 2023-07-14 NOTE — Patient Instructions (Addendum)
Prednisone 10 mg   x 2 daily (as your same ceiling)  until better then 1 daily x 5 days then one-half daily as your new "floor" dose    Only use your albuterol as a rescue medication to be used if you can't catch your breath by resting or doing a relaxed purse lip breathing pattern.  - The less you use it, the better it will work when you need it. - Ok to use up to 2 puffs  every 4 hours if you must but call for immediate appointment if use goes up over your usual need - Don't leave home without it !!  (think of it like the spare tire  for your car or starter fluid for an engine)  Also  Ok to try albuterol 15 min before an activity (on alternating days)  that you know would usually make you short of breath and see if it makes any difference and if makes none then don't take albuterol after activity unless you can't catch your breath as this means it's the resting that helps, not the albuterol.  Make sure you check your oxygen saturation  AT  your highest level of activity (not after you stop)   to be sure it stays over 90% and adjust  02 flow upward to maintain this level if needed but remember to turn it back to previous settings when you stop (to conserve your supply).   My office will be contacting you by phone for referral to lung cancer screening program at [phone]  - if you don't hear back from my office within one week please call us back or notify us thru MyChart and we'll address it right away.   The key is to stop smoking completely before smoking completely stops you!     Please schedule a follow up visit in 3 months but call sooner if needed

## 2023-07-15 NOTE — Assessment & Plan Note (Signed)
06/02/2023  sats 79% RA then required 2lpm for sats 91% at rest  then patient walked at moderate pace for 3 laps, desat to 81% on 3L, placed on 4L for last lap and sats > 90% - 06/02/2023 rec 2lpm at rest and up to 4lpm cont walking    As of 07/14/2023 rec 2lpm hs and adjust with exertion to maintain > 90% as goal

## 2023-07-15 NOTE — Assessment & Plan Note (Signed)
Counseled re importance of smoking cessation but did not meet time criteria for separate billing            Each maintenance medication was reviewed in detail including emphasizing most importantly the difference between maintenance and prns and under what circumstances the prns are to be triggered using an action plan format where appropriate.  Total time for H and P, chart review, counseling, reviewing hfa/ neb/ pulse ox/ 02  device(s) and generating customized AVS unique to this office visit / same day charting  > 30 min

## 2023-07-15 NOTE — Assessment & Plan Note (Signed)
Active smoker -  Allergy screen  12/25/21  >  Eos 0.4 - Labs ordered 02/11/2022  :    alpha one AT phenotype  MM level 177  - 02/11/2022    try trelegy 100 one click each am  - PFT's  03/26/22  FEV1 1.32  (36 % ) ratio 0.38  p 10 % improvement from saba p Trelegy prior to study with DLCO  9 (32%)  FV curve classically concave  - 05/20/2022  After extensive coaching inhaler device,  effectiveness =    90% with hfa > changed to breztri due to hoarseness on Trelegy > preferred  Trelegy 08/29/2022 so ok to resume - 01/24/2023   too much saba hfa while on trelegy 200 so change to breztri again with approp saba this time and Predisone x 6 day cycles as Plan C > 04/25/2023 did not follow ABC plan - 04/25/2023  After extensive coaching inhaler device,  effectiveness =    60% from a basline of 30%  (short Ti).  ABCD plan : A is Breztri and D -= 20 mg daily until better then 1 daily x 5 days only  -  07/14/2023  After extensive coaching inhaler device,  effectiveness =  60% (short Ti)  - 07/14/2023 changed floor prednisone dose to 5 mg    Group D (now reclassified as E) in terms of symptom/risk and laba/lama/ICS  therefore appropriate rx at this point >>>  continue breztri and more approp saba plus prednisone as above for now

## 2023-08-06 ENCOUNTER — Other Ambulatory Visit: Payer: Self-pay | Admitting: Internal Medicine

## 2023-08-06 NOTE — Telephone Encounter (Signed)
 Pt is requesting refill for Trelegy 200. Pt last seen on 07-14-2023. The lov does not say anything about Trelegy, and this rx was stated to be discontinued in 12-2022 by Dr Darlean. Please advise if this refill is appropriate. This has a last refill date of 07-17-2023.

## 2023-08-14 ENCOUNTER — Other Ambulatory Visit: Payer: Self-pay

## 2023-08-14 DIAGNOSIS — Z122 Encounter for screening for malignant neoplasm of respiratory organs: Secondary | ICD-10-CM

## 2023-08-14 DIAGNOSIS — Z87891 Personal history of nicotine dependence: Secondary | ICD-10-CM

## 2023-08-14 DIAGNOSIS — F17210 Nicotine dependence, cigarettes, uncomplicated: Secondary | ICD-10-CM

## 2023-09-13 ENCOUNTER — Other Ambulatory Visit: Payer: Self-pay

## 2023-09-13 ENCOUNTER — Emergency Department (HOSPITAL_COMMUNITY): Payer: BLUE CROSS/BLUE SHIELD

## 2023-09-13 ENCOUNTER — Emergency Department (HOSPITAL_COMMUNITY)
Admission: EM | Admit: 2023-09-13 | Discharge: 2023-09-14 | Discharge status: Against Medical Advice | Payer: BLUE CROSS/BLUE SHIELD | Attending: Emergency Medicine | Admitting: Emergency Medicine

## 2023-09-13 ENCOUNTER — Encounter (HOSPITAL_COMMUNITY): Payer: Self-pay | Admitting: Emergency Medicine

## 2023-09-13 DIAGNOSIS — J441 Chronic obstructive pulmonary disease with (acute) exacerbation: Secondary | ICD-10-CM | POA: Insufficient documentation

## 2023-09-13 DIAGNOSIS — F17210 Nicotine dependence, cigarettes, uncomplicated: Secondary | ICD-10-CM | POA: Diagnosis not present

## 2023-09-13 DIAGNOSIS — R0602 Shortness of breath: Secondary | ICD-10-CM | POA: Diagnosis present

## 2023-09-13 LAB — CBC
HCT: 39.1 % (ref 39.0–52.0)
Hemoglobin: 12.8 g/dL — ABNORMAL LOW (ref 13.0–17.0)
MCH: 33.1 pg (ref 26.0–34.0)
MCHC: 32.7 g/dL (ref 30.0–36.0)
MCV: 101 fL — ABNORMAL HIGH (ref 80.0–100.0)
Platelets: 338 10*3/uL (ref 150–400)
RBC: 3.87 MIL/uL — ABNORMAL LOW (ref 4.22–5.81)
RDW: 14.3 % (ref 11.5–15.5)
WBC: 32.4 10*3/uL — ABNORMAL HIGH (ref 4.0–10.5)
nRBC: 0 % (ref 0.0–0.2)

## 2023-09-13 MED ORDER — IPRATROPIUM BROMIDE 0.02 % IN SOLN
RESPIRATORY_TRACT | Status: AC
  Filled 2023-09-13: qty 2.5

## 2023-09-13 MED ORDER — ALBUTEROL SULFATE (2.5 MG/3ML) 0.083% IN NEBU
10.0000 mg/h | INHALATION_SOLUTION | Freq: Once | RESPIRATORY_TRACT | Status: AC
  Administered 2023-09-13: 10 mg/h via RESPIRATORY_TRACT

## 2023-09-13 MED ORDER — ALBUTEROL SULFATE (2.5 MG/3ML) 0.083% IN NEBU
INHALATION_SOLUTION | RESPIRATORY_TRACT | Status: AC
  Filled 2023-09-13: qty 18

## 2023-09-13 NOTE — ED Triage Notes (Signed)
 Pt presents to the ED via [facility] with complaints of SOB that started this evening. Pt states that his wife has been sick for the last few days. Hx of COPD and wears 2-4L [place] at baseline. Pt has has 7 breathing treatments PTA, 125mg  of Solumedrol, and 1 of atropine. A&Ox4 at this time. Denies CP.

## 2023-09-14 LAB — RESP PANEL BY RT-PCR (RSV, FLU A&B, COVID)  RVPGX2
Influenza A by PCR: NEGATIVE
Influenza B by PCR: NEGATIVE
Resp Syncytial Virus by PCR: NEGATIVE
SARS Coronavirus 2 by RT PCR: NEGATIVE

## 2023-09-14 LAB — COMPREHENSIVE METABOLIC PANEL
ALT: 13 U/L (ref 0–44)
AST: 13 U/L — ABNORMAL LOW (ref 15–41)
Albumin: 3.1 g/dL — ABNORMAL LOW (ref 3.5–5.0)
Alkaline Phosphatase: 72 U/L (ref 38–126)
Anion gap: 11 (ref 5–15)
BUN: 18 mg/dL (ref 6–20)
CO2: 27 mmol/L (ref 22–32)
Calcium: 8.6 mg/dL — ABNORMAL LOW (ref 8.9–10.3)
Chloride: 100 mmol/L (ref 98–111)
Creatinine, Ser: 0.79 mg/dL (ref 0.61–1.24)
GFR, Estimated: >60 mL/min (ref >60)
Glucose, Bld: 220 mg/dL — ABNORMAL HIGH (ref 70–99)
Potassium: 3.8 mmol/L (ref 3.5–5.1)
Sodium: 138 mmol/L (ref 135–145)
Total Bilirubin: 0.5 mg/dL (ref 0.0–1.2)
Total Protein: 6.8 g/dL (ref 6.5–8.1)

## 2023-09-14 LAB — BLOOD GAS, VENOUS
Acid-Base Excess: 6.4 mmol/L — ABNORMAL HIGH (ref 0.0–2.0)
Bicarbonate: 33.5 mmol/L — ABNORMAL HIGH (ref 20.0–28.0)
Drawn by: 62696
O2 Saturation: 53.8 %
Patient temperature: 36.9
pCO2, Ven: 58 mm[Hg] (ref 44–60)
pH, Ven: 7.37 (ref 7.25–7.43)
pO2, Ven: <31 mm[Hg] — CL (ref 32–45)

## 2023-09-14 LAB — TROPONIN I (HIGH SENSITIVITY)
Troponin I (High Sensitivity): 11 ng/L (ref <18)
Troponin I (High Sensitivity): 17 ng/L (ref <18)

## 2023-09-14 MED ORDER — ALBUTEROL SULFATE HFA 108 (90 BASE) MCG/ACT IN AERS: 2.0000 | INHALATION_SPRAY | RESPIRATORY_TRACT | 1 refills | Status: AC | PRN

## 2023-09-14 MED ORDER — PREDNISONE 20 MG PO TABS: 40.0000 mg | ORAL_TABLET | Freq: Every day | ORAL | 0 refills | Status: AC

## 2023-09-14 NOTE — ED Notes (Signed)
 Pt refusing to wear continuous O2 - EDP & RT made aware. Pt placed back on 6L [place].

## 2023-09-14 NOTE — ED Provider Notes (Signed)
 [facility]-EMERGENCY DEPT [facility] Emergency Department Provider Note MRN:  [id number]  Arrival date & time: 09/14/23     Chief Complaint   Shortness of Breath   History of Present Illness   Jerry Schaefer is a 58 y.o. year-old male with a history of COPD presenting to the ED with chief complaint of shortness of breath.  Gradual shortness of breath worsening over the past 2 days.  Became severe this evening.  Reportedly in the 60s on room air with EMS.  Review of Systems  A thorough review of systems was obtained and all systems are negative except as noted in the HPI and PMH.   Patient's Health History    Past Medical History:  Diagnosis Date   COPD (chronic obstructive pulmonary disease) (HCC)     History reviewed. No pertinent surgical history.  History reviewed. No pertinent family history.  Social History   Socioeconomic History   Marital status: Married    Spouse name: Not on file   Number of children: Not on file   Years of education: Not on file   Highest education level: Not on file  Occupational History   Not on file  Tobacco Use   Smoking status: Some Days    Current packs/day: 1.00    Types: Cigarettes   Smokeless tobacco: Never   Tobacco comments:    Cutting back on smoking  Substance and Sexual Activity   Alcohol use: No   Drug use: No   Sexual activity: Not on file  Other Topics Concern   Not on file  Social History Narrative   Not on file   Social Drivers of Health   Financial Resource Strain: Not on file  Food Insecurity: Not on file  Transportation Needs: Not on file  Physical Activity: Not on file  Stress: Not on file  Social Connections: Unknown (12/19/2021)   Received from [facility], [facility]   Social Network    Social Network: Not on file  Intimate Partner Violence: Unknown (12/19/2021)   Received from [employer], [facility]   HITS    Physically Hurt: Not on file    Insult or Talk Down To: Not on file    Threaten  Physical Harm: Not on file    Scream or Curse: Not on file     Physical Exam   Vitals:   09/14/23 0515 09/14/23 0515  BP: (!) 108/54   Pulse: 76   Resp: (!) 30   Temp:  98.4 F (36.9 C)  SpO2: 94%     CONSTITUTIONAL: Chronically ill-appearing, moderate to severe respiratory distress NEURO/PSYCH:  Alert and oriented x 3, no focal deficits EYES:  eyes equal and reactive ENT/NECK:  no LAD, no JVD CARDIO: Tachycardic rate, well-perfused, normal S1 and S2 PULM: Tachypneic, poor air movement, accessory muscle use GI/GU:  non-distended, non-tender MSK/SPINE:  No gross deformities, no edema SKIN:  no rash, atraumatic   *Additional and/or pertinent findings included in MDM below  Diagnostic and Interventional Summary    EKG Interpretation Date/Time:  Saturday September 13 2023 23:38:11 EST Ventricular Rate:  124 PR Interval:  125 QRS Duration:  97 QT Interval:  305 QTC Calculation: 438 R Axis:   92  Text Interpretation: Poor quality data, interpretation may be affected Sinus tachycardia Ventricular premature complex Consider right atrial enlargement Anterior infarct, acute (LAD) Lateral leads are also involved Artifact in lead(s) I II III aVR aVL aVF V1 V2 V3 V4 V5 V6 Confirmed by Kennis Carina [phone])  on 09/14/2023 12:10:06 AM       Labs Reviewed  CBC - Abnormal; Notable for the following components:      Result Value   WBC 32.4 (*)    RBC 3.87 (*)    Hemoglobin 12.8 (*)    MCV 101.0 (*)    All other components within normal limits  COMPREHENSIVE METABOLIC PANEL - Abnormal; Notable for the following components:   Glucose, Bld 220 (*)    Calcium 8.6 (*)    Albumin 3.1 (*)    AST 13 (*)    All other components within normal limits  BLOOD GAS, VENOUS - Abnormal; Notable for the following components:   pO2, Ven <31 (*)    Bicarbonate 33.5 (*)    Acid-Base Excess 6.4 (*)    All other components within normal limits  RESP PANEL BY RT-PCR (RSV, FLU A&B, COVID)  [id number]   TROPONIN I (HIGH SENSITIVITY)  TROPONIN I (HIGH SENSITIVITY)    DG Chest Port 1 View  Final Result      Medications  albuterol (PROVENTIL) (2.5 MG/3ML) 0.083% nebulizer solution (10 mg/hr Nebulization Given 09/13/23 2349)  ipratropium (ATROVENT) 0.02 % nebulizer solution (  Given 09/13/23 2349)  albuterol (PROVENTIL) (2.5 MG/3ML) 0.083% nebulizer solution (  Given 09/13/23 2349)     Procedures  /  Critical Care .Critical Care  Performed by: Sabas Sous, MD Authorized by: Sabas Sous, MD   Critical care provider statement:    Critical care time (minutes):  85   Critical care was necessary to treat or prevent imminent or life-threatening deterioration of the following conditions:  Respiratory failure   Critical care was time spent personally by me on the following activities:  Development of treatment plan with patient or surrogate, discussions with consultants, evaluation of patient's response to treatment, examination of patient, ordering and review of laboratory studies, ordering and review of radiographic studies, ordering and performing treatments and interventions, pulse oximetry, re-evaluation of patient's condition and review of old charts   ED Course and Medical Decision Making  Initial Impression and Ddx Suspect COPD exacerbation, pneumothorax is considered, overall doubt PE, doubt CHF exacerbation.  Patient currently refusing BiPAP, does not like how it feels.  Seems to be much improved compared to the description from EMS, responding to treatments so far.  Will monitor closely.  Past medical/surgical history that increases complexity of ED encounter: COPD  Interpretation of Diagnostics I personally reviewed the EKG and my interpretation is as follows: Sinus tachycardia  Labs reveal prominent leukocytosis.  Chest x-ray with expanded lungs  Patient Reassessment and Ultimate Disposition/Management     Patient does not want to be admitted, wants to go home.  Allowed  patient to stay in the emergency department overnight, talked him multiple times about being admitted to the hospital, still he does not want to.  Leaving the hospital AGAINST MEDICAL ADVICE.  Patient management required discussion with the following services or consulting groups:  None  Complexity of Problems Addressed Acute illness or injury that poses threat of life of bodily function  Additional Data Reviewed and Analyzed Further history obtained from: Prior labs/imaging results  Additional Factors Impacting ED Encounter Risk Consideration of hospitalization  Elmer Sow. Pilar Plate, MD [facility] Emergency Medicine [facility] [email or web address]  Final Clinical Impressions(s) / ED Diagnoses     ICD-10-CM   1. COPD exacerbation (HCC)  J44.1       ED Discharge Orders  Ordered    predniSONE (DELTASONE) 20 MG tablet  Daily        09/14/23 0612    albuterol (VENTOLIN HFA) 108 (90 Base) MCG/ACT inhaler  Every 4 hours PRN        09/14/23 0612             Discharge Instructions Discussed with and Provided to Patient:     Discharge Instructions      You are leaving the hospital AGAINST MEDICAL ADVICE.  You are having severe shortness of breath likely from a flare of your COPD.  You can take the prednisone and inhaler at home.  We made it clear that the risk of leaving the hospital AGAINST MEDICAL ADVICE includes worsening shortness of breath, low oxygen, death.  Return to the emergency department at anytime for continued care.       Sabas Sous, MD 09/14/23 [phone]

## 2023-09-14 NOTE — Discharge Instructions (Signed)
 You are leaving the hospital AGAINST MEDICAL ADVICE.  You are having severe shortness of breath likely from a flare of your COPD.  You can take the prednisone and inhaler at home.  We made it clear that the risk of leaving the hospital AGAINST MEDICAL ADVICE includes worsening shortness of breath, low oxygen, death.  Return to the emergency department at anytime for continued care.

## 2023-09-14 NOTE — ED Notes (Signed)
 Tried to place patient on BiPAP , patient stated he could not tolerate. Patient placed on Continuous neb CAT

## 2023-09-14 NOTE — ED Notes (Signed)
 Multiple attempts to reach family listed in pts contacts. Pt unable to reach family either for ride home.

## 2023-09-14 NOTE — ED Notes (Signed)
 Pt provided a phone to contact family in regards to provided a ride home.

## 2023-09-14 NOTE — ED Notes (Signed)
 Numerous attempts made to the patients S/O Lawson Fiscal) to provide a ride for discharge were unsuccessful. Will continue to try.

## 2023-09-14 NOTE — ED Notes (Signed)
 PT requested a call to be made to wife and brother to see if one of them could come pick him up. No answer from either at this time.

## 2023-09-29 ENCOUNTER — Ambulatory Visit (HOSPITAL_COMMUNITY)
Admission: RE | Admit: 2023-09-29 | Discharge: 2023-09-29 | Disposition: A | Discharge status: Home / Self Care | Payer: BLUE CROSS/BLUE SHIELD | Source: Ambulatory Visit | Attending: Family Medicine | Admitting: Family Medicine

## 2023-09-29 DIAGNOSIS — Z122 Encounter for screening for malignant neoplasm of respiratory organs: Secondary | ICD-10-CM | POA: Diagnosis present

## 2023-09-29 DIAGNOSIS — Z87891 Personal history of nicotine dependence: Secondary | ICD-10-CM | POA: Insufficient documentation

## 2023-09-29 DIAGNOSIS — F17210 Nicotine dependence, cigarettes, uncomplicated: Secondary | ICD-10-CM | POA: Diagnosis present

## 2023-10-05 ENCOUNTER — Other Ambulatory Visit: Payer: Self-pay | Admitting: Family Medicine

## 2023-10-05 DIAGNOSIS — E559 Vitamin D deficiency, unspecified: Secondary | ICD-10-CM

## 2023-10-07 ENCOUNTER — Other Ambulatory Visit: Payer: Self-pay | Admitting: Family Medicine

## 2023-10-07 DIAGNOSIS — R399 Unspecified symptoms and signs involving the genitourinary system: Secondary | ICD-10-CM

## 2023-10-13 ENCOUNTER — Telehealth: Payer: Self-pay

## 2023-10-13 NOTE — Telephone Encounter (Signed)
 Call Report from Tiffany:    IMPRESSION: 1. Lung-RADS 4B, suspicious. Additional imaging evaluation or consultation with Pulmonology or Thoracic Surgery recommended. Continued waxing and waning bilateral pulmonary nodularity, with numerous new patchy irregular bilateral pulmonary nodules throughout both lungs, largest 18.6 mm in the posterior right upper lobe. Findings are likely infectious / inflammatory. 2. Two-vessel coronary atherosclerosis. 3. Nonobstructing right nephrolithiasis. 4. Healing subacute posterolateral left eighth, ninth and tenth rib fractures. Moderate superior T8 vertebral compression fracture of uncertain chronicity, new from 09/25/2022 CT. 5. Aortic Atherosclerosis (ICD10-I70.0) and Emphysema (ICD10-J43.9).

## 2023-10-15 NOTE — Telephone Encounter (Signed)
 Spoke with Kandice Robinsons NP who reviewed pts scan and advised to check if pt has been sick. If so, pt can either go to PCP or with Dr. [name] in [place] (pt's preference) and be treated. Patient will need 3 month follow up scan, due 12/31/2023.

## 2023-10-16 NOTE — Telephone Encounter (Signed)
 Spoke with patient by phone, using two patient identifiers, to review results of LDCT.  Patient advised of abnormal lung findings.  He has been sick since 09/13/23 but has not sought any medical care/treatment. He has had chest congestion, coughing up discolored phlegm (difficult to get description), unknown if fever but has 'felt bad." Patient confirmed he is planning on keeping his appointment with Dr. [name] on 10/20/23.  Advised if fever develops or if symptoms worsen, to please seek care sooner.  Will follow Dr. Rolin Barry recommendations at [facility] for further orders.  Patient advised he will need a follow up CT and we will place order once he gets evaluated for illness.  Other findings were emphysema, atherosclerosis and kidney stone.  Patient had no questions.  Results to PCP and Dr. [name].

## 2023-10-16 NOTE — Telephone Encounter (Signed)
 Attempted to call patient. Phone rings and drops to busy signa. LVM on spouse's cell, request call back. Pt has f/u appt with Wert 10/20/2023 and can be treated by him if he has not seen PCP prior.

## 2023-10-18 NOTE — Progress Notes (Unsigned)
 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 [name] for COPD   Dyspnea:  walking at [employer] is a struggle  Cough: better p aecopd rx with doxy Sleep: level bed/ 2 pillows  SABA use: 2 puffs x 3 per day and neb up to bid  Rec Plan A = Automatic = Always=    Trelegy 100 one click take 2 good drags each am then rinse and gargle Plan B = Backup (to supplement plan A, not to replace it) Only use your albuterol inhaler as a rescue medication Plan C = Crisis (instead of Plan B but only if Plan B stops working) - only use your albuterol nebulizer if you first try Plan B and it fails to help  Ok to try albuterol 15 min before an activity (on alternating days)  that you know would usually make you short of breath  -  Allergy screen  12/25/21  >  Eos 0.4 - Labs ordered 02/11/2022  :    alpha one AT phenotype  MM level 177  - PFT's  03/26/22  FEV1 1.32  (36 % ) ratio 0.38  p 10 % improvement from saba p Trelegy prior to study with DLCO  9 (32%)  FV curve classically concave  - LDCT 03/06/22 RADS 2 MPN all < 7.2 mm     04/25/2023  post ER f/u ov/[place] office/Doretha Goding re: GOLD 3 copd maint on breztri  and still smoking  Chief Complaint  Patient presents with   COPD    Gold 3  Dyspnea:  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  Work on inhaler technique:   Plan B = Backup (to supplement plan A, not to replace it) Only use your albuterol inhaler as a rescue medication Plan C = Crisis (instead of Plan B but only if Plan B stops working) - only use your albuterol nebulizer if you first try Plan B  Plan D = Deltasone   - when ABC not working well prednisone  10 mg x 2 each am until better then on daily x 5 days and stop  For cough/ congestion :  mucinex dm 1200 mg every 12 hours as needed  Please schedule a follow up office visit in 6 weeks, call sooner if needed with all medications /inhalers/ solutions in hand   06/02/2023  f/u ov/[place] office/Lilou Kneip re: GOLD 3 copd  maint on breztri   did  bring inhalers/ not on 02 but still smoking  Chief Complaint  Patient presents with  COPD GOLD 3  Dyspnea:  [employer] has to stop every other aisle/ uses [employer] parking   Cough: mucinex helping  Sleeping: flat bed 2 pillows s    resp cc  SABA use: way too much  02: none  Rec We will be referring your for 24 hour oxygen starting with 2lpm at bedtime and make sure you check your oxygen saturation  AT  your highest level of activity (not after you stop)   to be sure it stays over 90% and adjust  02 flow upward to maintain this level if needed but remember to turn it back to previous settings when you stop (to conserve your supply).  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 - do this with an empty inhaler  Plan D = Deltasone   - when ABC not working well prednisone  10 mg x 2 each am until better then one daily x 5 days and stop    Cannot smoke with 02 in the same room We will start you on 02 2lpm at bedtime and at rest  and 4lpm with walking  Please schedule a follow up office visit in 6 weeks, call sooner if needed - bring your portable system with you and your inhalers     07/14/2023  f/u  ov/[place] office/Cecile Guevara re: GOLD 2 copd  maint on Breztri / stills smoking   Chief Complaint  Patient presents with   Follow-up    Follow up   Dyspnea:  no change / not wearing 02 as rec  Cough: none  Sleeping: flat bed 2 pillows s    resp cc  SABA use: no neb / no hfa / no pred x one week ran out  02: 2lpm at hs / prn  Rec     10/20/2023  f/u ov/[place] office/Antoria Lanza re: *** maint on ***  No chief complaint on file.   Dyspnea:  *** Cough: *** Sleeping: ***   resp cc  SABA use: *** 02: ***  Lung cancer screening: ***   No obvious day to day or daytime variability or assoc excess/ purulent sputum or mucus plugs or hemoptysis or cp or chest tightness, subjective wheeze or overt sinus or hb symptoms.    Also denies any obvious fluctuation of symptoms with weather or environmental changes or other aggravating or alleviating factors except as outlined above   No unusual exposure hx or h/o childhood pna/ asthma or knowledge of premature birth.  Current Allergies, Complete Past Medical History, Past Surgical History, Family History, and Social History were reviewed in [name].  ROS  The following are not active complaints unless bolded Hoarseness, sore throat, dysphagia, dental problems, itching, sneezing,  nasal congestion or discharge of excess mucus or purulent secretions, ear ache,   fever, chills, sweats, unintended wt loss or wt gain, classically pleuritic or exertional cp,  orthopnea pnd or arm/hand swelling  or leg swelling, presyncope, palpitations, abdominal pain, anorexia, nausea, vomiting, diarrhea  or change in bowel habits or change in bladder habits, change in stools or change in urine, dysuria, hematuria,  rash, arthralgias, visual complaints, headache, numbness, weakness or ataxia or problems with walking or coordination,  change in mood or  memory.        No outpatient medications have been marked as taking for the 10/20/23  encounter (Appointment) with Nyoka Cowden, MD.  Objective:    Wts  10/20/2023        ***  07/14/2023     119   06/02/2023       113  04/25/2023       109   01/24/2023       109   08/29/2022         120   05/20/22 118 lb 12.8 oz (53.9 kg)  03/27/22 121 lb 1.9 oz (54.9 kg)  02/11/22 119 lb 6.4 oz (54.2 kg)    Vital signs reviewed  10/20/2023  - Note at rest 02 sats  ***% on ***   General appearance:    ***     Mod bar***        Assessment

## 2023-10-20 ENCOUNTER — Ambulatory Visit: Payer: BC Managed Care – PPO | Admitting: Internal Medicine

## 2023-10-20 ENCOUNTER — Encounter: Payer: Self-pay | Admitting: Internal Medicine

## 2023-10-20 VITALS — BP 102/63 | HR 89 | Ht 69.0 in | Wt 107.0 lb

## 2023-10-20 DIAGNOSIS — F17210 Nicotine dependence, cigarettes, uncomplicated: Secondary | ICD-10-CM | POA: Diagnosis not present

## 2023-10-20 DIAGNOSIS — Z9981 Dependence on supplemental oxygen: Secondary | ICD-10-CM

## 2023-10-20 DIAGNOSIS — R918 Other nonspecific abnormal finding of lung field: Secondary | ICD-10-CM | POA: Diagnosis not present

## 2023-10-20 DIAGNOSIS — J9611 Chronic respiratory failure with hypoxia: Secondary | ICD-10-CM | POA: Diagnosis not present

## 2023-10-20 DIAGNOSIS — J449 Chronic obstructive pulmonary disease, unspecified: Secondary | ICD-10-CM | POA: Diagnosis not present

## 2023-10-20 MED ORDER — METHYLPREDNISOLONE ACETATE 80 MG/ML IJ SUSP
120.0000 mg | Freq: Once | INTRAMUSCULAR | Status: AC
  Administered 2023-10-20: 120 mg via INTRAMUSCULAR

## 2023-10-20 MED ORDER — LEVOFLOXACIN 500 MG PO TABS: 500.0000 mg | ORAL_TABLET | Freq: Every day | ORAL | 1 refills | Status: DC

## 2023-10-20 MED ORDER — PREDNISONE 10 MG PO TABS: ORAL_TABLET | ORAL | 1 refills | Status: DC

## 2023-10-20 NOTE — Assessment & Plan Note (Signed)
 Qualified for 02 06/02/23 06/02/2023  sats 79% RA then required 2lpm for sats 91% at rest  then patient walked at moderate pace for 3 laps, desat to 81% on 3L, placed on 4L for last lap and sats > 90% - 06/02/2023 rec 2lpm at rest and up to 4lpm cont walking    >>>10/20/2023  sats 85% on POC 2lpm > referred for best fit eval

## 2023-10-20 NOTE — Assessment & Plan Note (Signed)
 4-5 min discussion re active cigarette smoking in addition to office E&M  Ask about tobacco use:   ongoing Advise quitting   life or breath discussion, FEV1  36% so approaching GOLD 4 / endstage dz  Assess willingness:  Not committed at this point Assist in quit attempt:  Per PCP when ready Arrange follow up:   Follow up per Primary Care planned

## 2023-10-20 NOTE — Assessment & Plan Note (Addendum)
 Active smoker -  Allergy screen  12/25/21  >  Eos 0.4 - Labs ordered 02/11/2022  :    alpha one AT phenotype  MM level 177  - 02/11/2022    try trelegy 100 one click each am  - PFT's  03/26/22  FEV1 1.32  (36 % ) ratio 0.38  p 10 % improvement from saba p Trelegy prior to study with DLCO  9 (32%)  FV curve classically concave  - 05/20/2022  After extensive coaching inhaler device,  effectiveness =    90% with hfa > changed to breztri due to hoarseness on Trelegy > preferred  Trelegy 08/29/2022 so ok to resume - 01/24/2023   too much saba hfa while on trelegy 200 so change to breztri again with approp saba this time and Predisone x 6 day cycles as Plan C > 04/25/2023 did not follow ABC plan - 04/25/2023  After extensive coaching inhaler device,  effectiveness =    60% from a basline of 30%  (short Ti).  ABCD plan : A is Breztri and D -= 20 mg daily until better then 1 daily x 5 days only  -  07/14/2023  After extensive coaching inhaler device,  effectiveness =  60% (short Ti)  - 07/14/2023 changed floor prednisone dose to 5 mg > ran out as of 09/2023 and did not refill but doing a lot better on it     Group D (now reclassified as E) in terms of symptom/risk and laba/lama/ICS  therefore appropriate rx at this point >>>  still breztri approp   >>>> 10/20/2023  After extensive coaching inhaler device,  effectiveness =    60% (short ti) > continue breztri with low theshhold to change to triple rx per neb and resume Plan D as above plus depomedrol 120 mg IM at wife's request  Discussed in detail all the  indications, usual  risks and alternatives  relative to the benefits with patient who agrees to proceed with Rx as outlined.

## 2023-10-20 NOTE — Patient Instructions (Addendum)
 Depomedrol 120 mg IM   Work on inhaler technique:  relax and gently blow all the way out then take a nice smooth full deep breath back in, triggering the inhaler at same time you start breathing in.  Hold breath in for at least  5 seconds if you can. Blow out breztri  thru nose. Rinse and gargle with water when done.  If mouth or throat bother you at all,  try brushing teeth/gums/tongue with arm and hammer toothpaste/ make a slurry and gargle and spit out.       Remember plan D = ABC not working : Prednisone 10 mg   x 2 daily (your same ceiling)  until better then 1 daily x 5 days then one-half daily as your  "floor" dose   Levaquin 500 mg daily x 5 days then repeat x 5 days if no increase in joint pain especially achilles tendon  My office will be contacting you by phone for referral for Super  D CT chest  in 4 weekly   - if you don't hear back from my office within one week please call us back or notify us thru MyChart and we'll address it right away.    We are referring you for best fit for portable 02  (doesn't have to be a concentrator    Please schedule a follow up office visit in 6 weeks, call sooner if needed with all medications /inhalers/ solutions in hand so we can verify exactly what you are taking. This includes all medications from all doctors and over the counters

## 2023-10-20 NOTE — Assessment & Plan Note (Addendum)
 LDSCT chest  03/06/22  1. Lung-RADS 3, probably benign findings. Short-term follow-up in 6 months is recommended with repeat low-dose chest CT without contrast (please use the following order, "CT CHEST LCS NODULE FOLLOW-UP W/O CM"). Multiple pulmonary nodules of maximally volume derived equivalent diameter 7.4 mm>> repeat 09/25/22  wax/ wane pattern Repeat  09/29/23 Continued waxing and waning bilateral pulmonary nodularity, with numerous new patchy irregular bilateral pulmonary nodules throughout both lungs, largest 18.6 mm in the posterior right upper lobe. Findings are likely infectious / inflammatory.  >>>   10/20/2023 rx levaquin 500 x 5 day x 2 cycles then Super CD CT next step for atypical TB vs neoplasm w/u   Discussed in detail all the  indications, usual  risks and alternatives  relative to the benefits with patient who agrees to proceed with w/u as outlined.            Each maintenance medication was reviewed in detail including emphasizing most importantly the difference between maintenance and prns and under what circumstances the prns are to be triggered using an action plan format where appropriate.  Total time for H and P, chart review, counseling, reviewing hfa/neb/02/pulse ox  device(s) and generating customized AVS unique to this office visit / same day charting = 40 min visit with complex high risk pt

## 2023-10-22 ENCOUNTER — Telehealth: Payer: Self-pay | Admitting: Internal Medicine

## 2023-10-22 NOTE — Telephone Encounter (Signed)
 Spoke with patient regarding the CT chest ordered by Dr. Wynell Balloon requested Monday morning appointment at [facility] scheduled Monday 12/08/23 at 8:00 am ---arrival time is 7:30 am in radiology at [facility].  Will mail informatin to patient and he ovoiced his understanding

## 2023-11-06 ENCOUNTER — Other Ambulatory Visit: Payer: Self-pay | Admitting: Family Medicine

## 2023-11-06 DIAGNOSIS — J301 Allergic rhinitis due to pollen: Secondary | ICD-10-CM

## 2023-11-30 NOTE — Progress Notes (Unsigned)
 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 Zoila Schaefer for COPD   Dyspnea:  walking at [employer] is a struggle  Cough: better p aecopd rx with doxy Sleep: level bed/ 2 pillows  SABA use: 2 puffs x 3 per day and neb up to bid  Rec Plan A = Automatic = Always=    Trelegy 100 one click take 2 good drags each am then rinse and gargle Plan B = Backup (to supplement plan A, not to replace it) Only use your albuterol  inhaler as a rescue medication Plan C = Crisis (instead of Plan B but only if Plan B stops working) - only use your albuterol  nebulizer if you first try Plan B and it fails to help  Ok to try albuterol  15 min before an activity (on alternating days)  that you know would usually make you short of breath  -  Allergy screen  12/25/21  >  Eos 0.4 - Labs ordered 02/11/2022  :    alpha one AT phenotype  MM level 177  - PFT's  03/26/22  FEV1 1.32  (36 % ) ratio 0.38  p 10 % improvement from saba p Trelegy prior to study with DLCO  9 (32%)  FV curve classically concave  - LDCT 03/06/22 RADS 2 MPN all < 7.2 mm     04/25/2023  post ER f/u ov/Jerry Schaefer office/Jerry Schaefer re: GOLD 3 copd maint on breztri   and still smoking  Chief Complaint  Patient presents with   COPD    Gold 3  Dyspnea:  47ft and gives out / still truck driving  Cough: thick and white esp in am    Sleeping: flat bed 2 pillows s   resp cc  SABA use: 4x days  02: none Lung cancer screening: referred  Rec Plan A =  Automatic = Always=    Breztri  Take 2 puffs first thing in am and then another 2 puffs about 12 hours later.  Work on inhaler technique:   Plan B = Backup (to supplement plan A, not to replace it) Only use your albuterol  inhaler as a rescue medication Plan C = Crisis (instead of Plan B but only if Plan B stops working) - only use your albuterol  nebulizer if you first try Plan B  Plan D = Deltasone    - when ABC not working well prednisone   10 mg x 2 each am until better then on daily x 5 days and stop  For cough/ congestion :  mucinex dm 1200 mg every 12 hours as needed  Please schedule a follow up office visit in 6 weeks, call sooner if needed with all medications /inhalers/ solutions in hand    06/02/2023  f/u ov/[place] office/Jerry Schaefer re: GOLD 3 copd  maint on breztri    did  bring inhalers/ not on 02 but still smoking  Chief  Complaint  Patient presents with   COPD GOLD 3  Dyspnea:  [employer] has to stop every other aisle/ uses [employer] parking   Cough: mucinex helping  Sleeping: flat bed 2 pillows s    resp cc  SABA use: way too much  02: none  Rec We will be referring your for 24 hour oxygen  starting with 2lpm at bedtime and make sure you check your oxygen  saturation  AT  your highest level of activity (not after you stop)   to be sure it stays over 90% and adjust  02 flow upward to maintain this level if needed but remember to turn it back to previous settings when you stop (to conserve your supply).  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 - do this with an empty inhaler  Plan D = Deltasone    - when ABC not working well prednisone   10 mg x 2 each am until better then one daily x 5 days and stop    Cannot smoke with 02 in the same room We will start you on 02 2lpm at bedtime and at rest  and 4lpm with walking  Please schedule a follow up office visit in 6 weeks, call sooner if needed - bring your portable system with you and your inhalers      07/14/2023  f/u ov/Jerry Schaefer office/Jerry Schaefer re: GOLD 3 copd  maint on Breztri  / stills smoking   Chief Complaint  Patient presents with   Follow-up    Follow up   Dyspnea:  no change / not wearing 02 as rec  Cough: none  Sleeping: flat bed 2 pillows s    resp cc  SABA use: no neb / no hfa / no pred x one week ran out  02: 2lpm at hs / prn  Rec Prednisone  10 mg   x 2 daily (as your same ceiling)  until better then 1 daily x 5 days then one-half daily as your new "floor" dose  Only use your albuterol  as a rescue medication Also  Ok to try albuterol  15 min before an activity (on alternating days)  that you know would usually make you short of breath  Make sure you check your oxygen  saturation  AT  your highest level of activity (not after you stop)   to be sure it stays over 90%     The key is to stop smoking completely before smoking completely stops you!   LDSCT  09/29/23  Continued waxing and waning bilateral pulmonary nodularity, with numerous new patchy irregular bilateral pulmonary nodules throughout both lungs, largest 18.6 mm in the posterior right upper lobe. Findings are likely infectious / inflammatory.   10/20/2023  f/u ov/Jerry Schaefer office/Jerry Schaefer re: GOLD 3/ 02 dep  maint on breztri   and too much saba since ran out of "plan D = prednisone "  above about a month prior to OV    Dyspnea:  says can't push a buggy even with 02 but POC no longer providing adequate sats  Cough: clear white min volume Sleeping: wedge pillow  s resp cc  SABA use: hfa 6 x daily / neb once a day  02: 5lpm concentrator  Rec Depomedrol 120 mg IM  Work on inhaler technique:   Remember plan D = ABC not working : Prednisone  10 mg   x 2 daily (your same ceiling)  until better then 1 daily x 5 days then one-half daily as your  "  floor" dose  Levaquin  500 mg daily x 5 days then repeat x 5 days if no increase in joint pain especially achilles tendon  will be contacting you by phone for referral for Super   D CT chest  in 4 weeks = 12/08/23  We are referring you for best fit for portable 02  (doesn't have to be a concentrator)  Please schedule a follow up office visit in 6 weeks, call sooner if needed with all medications /inhalers/ solutions in hand     12/01/2023  f/u ov/[place] office/Weslee Prestage re: GOLD 3/02 dep maint on trelegy 100 did not  bring meds - says not smoking at all / SPN f/u pending  Chief Complaint  Patient presents with   Shortness of Breath   Cough  Dyspnea:  sob at rest  Cough: clear white  Sleeping: flat bed/ wedge pillow s  resp cc  SABA use: neb bid/ hfa 2-3 x daily  02: 4lpm cont  and 4lpm POC    No obvious day to day or daytime variability or assoc excess/ purulent sputum or mucus plugs or hemoptysis or cp or chest tightness, subjective wheeze or overt sinus or hb symptoms.    Also denies any obvious fluctuation of symptoms with weather or environmental changes or other aggravating or alleviating factors except as outlined above   No unusual exposure hx or h/o childhood pna/ asthma or knowledge of premature birth.  Current Allergies, Complete Past Medical History, Past Surgical History, Family History, and Social History were reviewed in [name].  ROS  The following are not active complaints unless bolded Hoarseness, sore throat, dysphagia, dental problems, itching, sneezing,  nasal congestion or discharge of excess mucus or purulent secretions, ear ache,   fever, chills, sweats, unintended wt loss or wt gain, classically pleuritic or exertional cp,  orthopnea pnd or arm/hand swelling  or leg swelling, presyncope, palpitations, abdominal pain, anorexia, nausea, vomiting, diarrhea  or change in bowel habits or change in bladder habits, change in stools or change in urine, dysuria, hematuria,  rash, arthralgias, visual complaints, headache, numbness, weakness or ataxia or problems with walking or coordination,  change in mood or  memory.         Current Meds  Medication Sig   albuterol  (PROVENTIL ) (2.5 MG/3ML) 0.083% nebulizer solution Take 3 mLs (2.5 mg total) by nebulization every 4 (four) hours as needed for wheezing or shortness of breath.   albuterol  (VENTOLIN  HFA) 108 (90 Base) MCG/ACT inhaler Inhale 2 puffs into the lungs every 4 (four) hours as needed for wheezing or shortness of breath.   Budeson-Glycopyrrol-Formoterol (BREZTRI  AEROSPHERE) 160-9-4.8 MCG/ACT AERO Take 2 puffs first thing in am and then another 2 puffs about 12 hours later.   Fluticasone -Umeclidin-Vilant (TRELEGY ELLIPTA ) 200-62.5-25 MCG/ACT AEPB inhale 1 PUFF DAILY IN THE MORNING   predniSONE  (DELTASONE ) 10 MG tablet Prednisone  10 mg   x 2 daily  until better then 1 daily x 5 days then one-half daily             Objective:    Wts  12/01/2023         110 10/20/2023       107  07/14/2023     119   06/02/2023       113  04/25/2023       109   01/24/2023       109   08/29/2022         120   05/20/22  118 lb 12.8 oz (53.9 kg)  03/27/22 121 lb 1.9 oz (54.9 kg)  02/11/22 119 lb 6.4 oz (54.2 kg)    Vital signs reviewed  12/01/2023  - Note at rest 02 sats  84% on  4lpm  POC   General appearance:    w/c bound elderly wm sob at rest   HEENT :  Oropharynx  clear   Nasal turbinates nl    NECK :  without JVD/Nodes/TM/ nl carotid upstrokes bilaterally   LUNGS: no acc muscle use,  Mod barrel  contour chest wall with bilateral  Distant bs s audible wheeze and  without cough on insp or exp maneuvers and mod  Hyperresonant  to  percussion bilaterally     CV:  RRR  no s3 or murmur or increase in P2, and no edema   ABD:  soft and nontender with pos mid insp Hoover's  in the supine position. No bruits or organomegaly appreciated, bowel sounds nl  MS:   Ext warm without deformities or   obvious joint restrictions , calf tenderness, cyanosis or clubbing  SKIN: warm and dry without lesions    NEURO:  alert, approp, nl sensorium with  no motor or cerebellar deficits  apparent.            Assessment

## 2023-12-01 ENCOUNTER — Encounter: Payer: Self-pay | Admitting: Internal Medicine

## 2023-12-01 ENCOUNTER — Ambulatory Visit: Payer: Self-pay | Admitting: Internal Medicine

## 2023-12-01 VITALS — BP 103/72 | HR 102 | Wt 110.0 lb

## 2023-12-01 DIAGNOSIS — J449 Chronic obstructive pulmonary disease, unspecified: Secondary | ICD-10-CM

## 2023-12-01 DIAGNOSIS — J9611 Chronic respiratory failure with hypoxia: Secondary | ICD-10-CM

## 2023-12-01 DIAGNOSIS — R918 Other nonspecific abnormal finding of lung field: Secondary | ICD-10-CM

## 2023-12-01 MED ORDER — TRELEGY ELLIPTA 100-62.5-25 MCG/ACT IN AEPB: 1.0000 | INHALATION_SPRAY | Freq: Every morning | RESPIRATORY_TRACT | Status: DC

## 2023-12-01 NOTE — Patient Instructions (Addendum)
 My office will be contacting you by phone for referral to [facility] for portable oxygen  ([phone])  - the goal is to keep the 02 saturation above 90%  - if you don't hear back from my office within one week please call us  back or notify us  thru MyChart and we'll address it right away.   Congratulations on not smoking, it's the most important aspect of your care  We will try to get you Trelegy 100 thru [employer]   Your EPIC records support full disability and social security should have access to them already.    Please schedule a follow up visit in 3 months but call sooner if needed

## 2023-12-02 NOTE — Assessment & Plan Note (Signed)
 Qualified for 02 06/02/23 06/02/2023  sats 79% RA then required 2lpm for sats 91% at rest  then patient walked at moderate pace for 3 laps, desat to 81% on 3L, placed on 4L for last lap and sats > 90% - 06/02/2023 rec 2lpm at rest and up to 4lpm cont walking   - 10/20/2023  sats 85% on POC 2lpm > referred for best fit eval> again 12/01/2023   Advised Make sure you check your oxygen  saturation  AT  your highest level of activity (not after you stop)   to be sure it stays over 90% and adjust  02 flow upward to maintain this level if needed but remember to turn it back to previous settings when you stop (to conserve your supply).   F/u q 3 m, sooner prn         Each maintenance medication was reviewed in detail including emphasizing most importantly the difference between maintenance and prns and under what circumstances the prns are to be triggered using an action plan format where appropriate.  Total time for H and P, chart review, counseling, reviewing hfa/02/pulse ox/neb  device(s) and generating customized AVS unique to this office visit / same day charting = 25 min

## 2023-12-02 NOTE — Assessment & Plan Note (Signed)
 Active smoker -  Allergy screen  12/25/21  >  Eos 0.4 - Labs ordered 02/11/2022  :    alpha one AT phenotype  MM level 177  - 02/11/2022    try trelegy 100 one click each am  - PFT's  03/26/22  FEV1 1.32  (36 % ) ratio 0.38  p 10 % improvement from saba p Trelegy prior to study with DLCO  9 (32%)  FV curve classically concave  - 05/20/2022  After extensive coaching inhaler device,  effectiveness =    90% with hfa > changed to breztri  due to hoarseness on Trelegy > preferred  Trelegy 08/29/2022 so ok to resume - 01/24/2023   too much saba hfa while on trelegy 200 so change to breztri  again with approp saba this time and Predisone x 6 day cycles as Plan C > 04/25/2023 did not follow ABC plan - 04/25/2023  After extensive coaching inhaler device,  effectiveness =    60% from a basline of 30%  (short Ti).  ABCD plan : A is Breztri  and D -= 20 mg daily until better then 1 daily x 5 days only  -  07/14/2023  After extensive coaching inhaler device,  effectiveness =  60% (short Ti)  - 07/14/2023 changed floor prednisone  dose to 5 mg > ran out as of 09/2023 and did not refill but doing a lot better on it  - 10/20/2023  After extensive coaching inhaler device,  effectiveness =    60% (short ti) > continue breztri  with low theshhold to change to triple rx per neb and resume Plan D as above    Group D (now reclassified as E) in terms of symptom/risk and laba/lama/ICS  therefore appropriate rx at this point >>>  continue breztri  and more appropriate saba:  Re SABA :  I spent extra time with pt today reviewing appropriate use of albuterol  for prn use on exertion with the following points: 1) saba is for relief of sob that does not improve by walking a slower pace or resting but rather if the pt does not improve after trying this first. 2) If the pt is convinced, as many are, that saba helps recover from activity faster then it's easy to tell if this is the case by re-challenging : ie stop, take the inhaler, then p 5 minutes  try the exact same activity (intensity of workload) that just caused the symptoms and see if they are substantially diminished or not after saba 3) if there is an activity that reproducibly causes the symptoms, try the saba 15 min before the activity on alternate days   If in fact the saba really does help, then fine to continue to use it prn but advised may need to look closer at the maintenance regimen being used to achieve better control of airways disease with exertion.

## 2023-12-02 NOTE — Assessment & Plan Note (Signed)
 LDSCT chest  03/06/22  1. Lung-RADS 3, probably benign findings. Short-term follow-up in 6 months is recommended with repeat low-dose chest CT without contrast (please use the following order, "CT CHEST LCS NODULE FOLLOW-UP W/O CM"). Multiple pulmonary nodules of maximally volume derived equivalent diameter 7.4 mm>> repeat 09/25/22  wax/ wane pattern Repeat  09/29/23 Continued waxing and waning bilateral pulmonary nodularity, with numerous new patchy irregular bilateral pulmonary nodules throughout both lungs, largest 18.6 mm in the posterior right upper lobe. Findings are likely infectious / inflammatory. - 10/20/2023 rx levaquin  500 x 5 day x 2 cycles then Super CD CT > pending for 12/08/23

## 2023-12-08 ENCOUNTER — Ambulatory Visit (HOSPITAL_COMMUNITY): Payer: Self-pay

## 2023-12-24 ENCOUNTER — Telehealth: Payer: Self-pay | Admitting: Internal Medicine

## 2023-12-24 MED ORDER — TRELEGY ELLIPTA 100-62.5-25 MCG/ACT IN AEPB: 1.0000 | INHALATION_SPRAY | Freq: Every morning | RESPIRATORY_TRACT | 11 refills | Status: DC

## 2023-12-24 NOTE — Telephone Encounter (Signed)
 Copied from CRM [id number]. Topic: Clinical - Medication Prior Auth >> Dec 24, 2023 12:28 PM Corean Deutscher wrote: Reason for CRM: patient called stating he is trying to enroll in [employer] and they need a prescription with Dr.Wert signature on it for the medication of Fluticasone -Umeclidin-Vilant (TRELEGY ELLIPTA ) 200-62.5-25 MCG/ACT AEPB, a 3 month supply.

## 2024-01-02 ENCOUNTER — Other Ambulatory Visit: Payer: Self-pay | Admitting: Internal Medicine

## 2024-01-02 DIAGNOSIS — J449 Chronic obstructive pulmonary disease, unspecified: Secondary | ICD-10-CM

## 2024-01-05 ENCOUNTER — Ambulatory Visit: Payer: Self-pay

## 2024-01-05 ENCOUNTER — Other Ambulatory Visit: Payer: Self-pay | Admitting: Internal Medicine

## 2024-01-05 DIAGNOSIS — J449 Chronic obstructive pulmonary disease, unspecified: Secondary | ICD-10-CM

## 2024-01-05 NOTE — Telephone Encounter (Signed)
 I called and spoke with the pt  He states that he was calling to check on the status of portable o2  He has a large concentrator and tank at home  He was expecting something more portable  Routing to [facility] to see if they can contact DME to see what happened with portable o2

## 2024-01-05 NOTE — Telephone Encounter (Signed)
 Copied from CRM [id number]. Topic: Clinical - Medication Refill >> Jan 05, 2024 11:30 AM Ilene Malling wrote: Most Recent Pulmonary Care Visit:   Provider: Dr. Vernestine Gondola Department: [facility] Date: 12/01/23  Medication(s): predniSONE  (DELTASONE ) 10 MG tablet    Has the patient contacted their pharmacy? Yes, the pharmacy advised patient to contact the office (Agent: If no, request that the patient contact the pharmacy for the refill. If patient does not wish to contact the pharmacy document the reason why and proceed with request.) (Agent: If yes, when and what did the pharmacy advise?)  This is the patient's preferred pharmacy:  [facility] - [place], [place] - [address] [place] [address] Phone: [phone] Fax: [phone]  Is this the correct pharmacy for this prescription? Yes If no, delete pharmacy and type the correct one.   Has the prescription been filled recently? No  Is the patient out of the medication? No, has two pills left.  Has the patient been seen for an appointment in the last year OR does the patient have an upcoming appointment? Yes, 03/08/24  Can we respond through MyChart? No, please back [phone]  Agent: Please be advised that Rx refills may take up to 3 business days. We ask that you follow-up with your pharmacy.

## 2024-01-05 NOTE — Telephone Encounter (Signed)
 Copied from CRM [id number]. Topic: Clinical - Red Word Triage >> Jan 05, 2024 11:30 AM Justina Oman C wrote: Red Word that prompted transfer to Nurse Triage: Patient [phone] states spoke with [facility] [phone] last week and they have not received the oxygen  order from Dr. [name]. Patient states feels like shortness of breath, wheezing, headaches, dizziness worsen when first thing in the morning, talking and when going out. Patient denies fever nor pain. Please advise.  FYI Only or Action Required?: Action required by provider  Patient is followed in Pulmonology for COPD, last seen on 12/01/2023 by Diamond Formica, MD. Called Nurse Triage reporting Shortness of Breath. Symptoms began ongoing, no complaints nowSymptoms are: unchanged.  Triage Disposition: Call PCP Now  Patient/caregiver understands and will follow disposition?:   Reason for Disposition  [1] Prescription refill request for ESSENTIAL medicine (i.e., likelihood of harm to patient if not taken) AND [2] triager unable to refill per department policy  Answer Assessment - Initial Assessment Questions 1. DRUG NAME: "What medicine do you need to have refilled?"     Prednisone   4. PRESCRIBING HCP: "Who prescribed it?" Reason: If prescribed by specialist, call should be referred to that group.     Dr. [name] 5. SYMPTOMS: "Do you have any symptoms?"     Some shortness of breath when waking up  Protocols used: Medication Refill and Renewal Call-A-AH

## 2024-01-06 NOTE — Telephone Encounter (Signed)
 I have sent this to [facility] to check the status

## 2024-01-07 NOTE — Telephone Encounter (Signed)
 Per Devra Fontana at [facility]-  We cannot provide him with portable tanks since he has the POC. Insurance pays for one or the other.

## 2024-01-07 NOTE — Telephone Encounter (Signed)
 I have spoke with the patient he current does have a 10L concentrator, a POC and a M60tank for back up. He did state that the POC doesn't last long enough and he would like 2 more tanks with wheels on them.  Please advise. Thanks!

## 2024-01-08 ENCOUNTER — Other Ambulatory Visit: Payer: Self-pay

## 2024-01-08 ENCOUNTER — Telehealth: Payer: Self-pay

## 2024-01-08 DIAGNOSIS — J449 Chronic obstructive pulmonary disease, unspecified: Secondary | ICD-10-CM

## 2024-01-08 MED ORDER — PREDNISONE 10 MG PO TABS
ORAL_TABLET | ORAL | 0 refills | Status: DC
Start: 2024-01-08 — End: 2024-03-08

## 2024-01-08 NOTE — Telephone Encounter (Signed)
 Copied from CRM [id number]. Topic: Clinical - Prescription Issue >> Jan 08, 2024 12:46 PM Dyann Glaser G wrote: Reason for CRM: PT CALLED STATED HE HAS NOT GOTTEN HIS predniSONE  (DELTASONE ) 10 MG tablet AND SPOKE WITH PHARMACY TODAY AND STILL NOT FILLED.

## 2024-01-08 NOTE — Telephone Encounter (Signed)
 I have sent this to the DME company

## 2024-01-08 NOTE — Telephone Encounter (Signed)
 Call to inform Nadia that I refilled his prednisone 

## 2024-01-19 ENCOUNTER — Ambulatory Visit (HOSPITAL_COMMUNITY)
Admission: RE | Admit: 2024-01-19 | Discharge: 2024-01-19 | Disposition: A | Discharge status: Home / Self Care | Payer: Self-pay | Source: Ambulatory Visit | Attending: Internal Medicine | Admitting: Internal Medicine

## 2024-01-19 DIAGNOSIS — R918 Other nonspecific abnormal finding of lung field: Secondary | ICD-10-CM | POA: Insufficient documentation

## 2024-01-31 ENCOUNTER — Ambulatory Visit: Payer: Self-pay | Admitting: Internal Medicine

## 2024-02-04 MED ORDER — AMOXICILLIN-POT CLAVULANATE 875-125 MG PO TABS: 1.0000 | ORAL_TABLET | Freq: Two times a day (BID) | ORAL | 0 refills | Status: DC

## 2024-02-04 NOTE — Progress Notes (Signed)
 ATC x1 regarding rx I sent to pharmacy. LMTCB

## 2024-02-04 NOTE — Progress Notes (Signed)
 Spoke with pt regarding rx I sent to pharmacy, NFN

## 2024-02-09 ENCOUNTER — Telehealth: Payer: Self-pay | Admitting: Internal Medicine

## 2024-02-09 DIAGNOSIS — R918 Other nonspecific abnormal finding of lung field: Secondary | ICD-10-CM

## 2024-02-09 NOTE — Telephone Encounter (Signed)
 CT s contrast f/u nodules order for 04/20/24 with ov a week later

## 2024-02-09 NOTE — Telephone Encounter (Signed)
-----   Message from Lauraine JULIANNA Lites sent at 02/09/2024  5:08 PM EDT ----- Regarding: RE: CT Results He will need the 3 month follow up scan  in September. Dr. Darlean, will you order after you see him in June? Thanks ----- Message ----- From: [name], RN Sent: 02/02/2024  10:47 AM EDT To: Lauraine JULIANNA Lites, NP Subject: CT Results                                     Hi [name]  CT in March was a 4B, Dr. Darlean saw him soon after scan and ordered a Super D CT completed 01/19/2024 and resulted 01/30/2024. Results below. Pt is scheduled to see Dr. Darlean 03/08/2024  Jerry  IMPRESSION: 1. New centrally cavitary nodule in the superior segment of the left lower lobe with adjacent ill-defined nodules laterally in the left lower lobe. Findings are nonspecific, but likely infectious/inflammatory given recent development. Continued short-term (3-6 month) CT follow-up recommended. 2. Stable prominent precarinal lymph node, likely reactive. No other enlarged mediastinal or hilar lymph nodes identified. 3. Otherwise grossly stable chronic lung disease with multifocal postinflammatory scarring. 4. Multiple subacute/healing rib fractures bilaterally, including new fractures involving the left 4th rib laterally and both 7th ribs posteriorly. Suspected new mild superior endplate compression deformity at T5 and inferior endplate compression deformity at T7. 5. Aortic Atherosclerosis (ICD10-I70.0) and Emphysema (ICD10-J43.9).

## 2024-02-10 NOTE — Telephone Encounter (Signed)
 Order for ct and ov with MW the following wk was placed.

## 2024-02-24 ENCOUNTER — Ambulatory Visit: Payer: Self-pay

## 2024-02-24 ENCOUNTER — Telehealth: Payer: Self-pay | Admitting: Internal Medicine

## 2024-02-24 ENCOUNTER — Other Ambulatory Visit: Payer: Self-pay

## 2024-02-24 MED ORDER — PREDNISONE 10 MG PO TABS: 10.0000 mg | ORAL_TABLET | Freq: Every day | ORAL | 0 refills | Status: DC

## 2024-02-24 NOTE — Telephone Encounter (Signed)
 FYI Only or Action Required?: Action required by provider: medication refill request and update on patient condition.  Patient is followed in Pulmonology for COPD, last seen on 12/01/2023 by Darlean Ozell NOVAK, MD.  Called Nurse Triage reporting Chest Pain and Medication Refill.  Symptoms began several months ago.  Interventions attempted: Prescription medications: predniSONE  (DELTASONE ) 10 MG tablet [6494] and Rescue inhaler.  Symptoms are: unchanged.  Triage Disposition: See Physician Within 24 Hours  Patient/caregiver understands and will follow disposition?: Unsure     Reason for Disposition  [1] Chest pain lasts > 5 minutes AND [2] occurred > 3 days ago (72 hours) AND [3] NO chest pain or cardiac symptoms now  Additional Information  Commented on: Answer Assessment    Triager attempted to schedule appt with PCP, but no access. Triager strongly advised pt seen medical attention at UC/ED-- pt endorsed concerns for not having insurance/financial concerns. Triager reinforced importance of HCP to evaluate/tx.   Of note, pt initially calling for Prednisone  refill. Upon further investigation, pt is still currently taking 2 pills of taper: predniSONE  (DELTASONE ) 10 MG tablet [6494] that prescribed on 01/08/24. Triager will forward encounter for Dr. Darlean 's office to review and advise. Patient verbalized understanding and is expecting call back from office for next steps/if refill appropriate. Triager also advised that if pt does not hear back from office, to follow disposition for further evaluation/treatment.  Answer Assessment - Initial Assessment Questions E2C2 Pulmonary Triage - Initial Assessment Questions Chief Complaint (e.g., cough, sob, wheezing, fever, chills, sweat or additional symptoms) *Go to specific symptom protocol after initial questions. SOB, white/clear productive, endorses sinus issues  How long have symptoms been present? February   Have you tested for COVID or  Flu? Note: If not, ask patient if a home test can be taken. If so, instruct patient to call back for positive results. No  MEDICINES:   Have you used any OTC meds to help with symptoms? Yes If yes, ask What medications? Alkaselser sinus for sleep at night  Have you used your inhalers/maintenance medication? Yes If yes, What medications? Fluticasone -Umeclidin-Vilant (TRELEGY ELLIPTA ) - pt reports lost insurance and unable to afford Rx - endorses ran out last week Albuterol  INH PRN - last used this AM, 2 puffs with minimal relief Triager reviewed/reinforced albuterol  usage and SIG.    If inhaler, ask How many puffs and how often? Note: Review instructions on medication in the chart. See above  OXYGEN : Do you wear supplemental oxygen ? Yes If yes, How many liters are you supposed to use? 2-3L ATC  Do you monitor your oxygen  levels? Yes If yes, What is your reading (oxygen  level) today? Reports in 70 on RA this AM (reports O2 fell of in the middle of the night) Reports rechecked with O2 at 90%  What is your usual oxygen  saturation reading?  (Note: Pulmonary O2 sats should be 90% or greater) 90-92 on O2       1. RESPIRATORY STATUS: Describe your breathing? (e.g., wheezing, shortness of breath, unable to speak, severe coughing)      See above 2. ONSET: When did this breathing problem begin?      Since February 3. PATTERN Does the difficult breathing come and go, or has it been constant since it started?      intermittent 4. SEVERITY: How bad is your breathing? (e.g., mild, moderate, severe)      Mild-moderate - pt reports notices at rest as well Triager does not appreciate audible SOB/wheezing during call.  Pt is speaking in full sentences.  5. RECURRENT SYMPTOM: Have you had difficulty breathing before? If Yes, ask: When was the last time? and What happened that time?      Ongoing since February 6. CARDIAC HISTORY: Do you have any history of  heart disease? (e.g., heart attack, angina, bypass surgery, angioplasty)      denies 7. LUNG HISTORY: Do you have any history of lung disease?  (e.g., pulmonary embolus, asthma, emphysema)     COPD 8. CAUSE: What do you think is causing the breathing problem?      flare 9. OTHER SYMPTOMS: Do you have any other symptoms? (e.g., chest pain, cough, dizziness, fever, runny nose)     Endorses L CP d/t pulled muscle - does not have CP currently (last felt yesterday and lasted most of the day yesterday 10. O2 SATURATION MONITOR:  Do you use an oxygen  saturation monitor (pulse oximeter) at home? If Yes, ask: What is your reading (oxygen  level) today? What is your usual oxygen  saturation reading? (e.g., 95%)       See above 11. PREGNANCY: Is there any chance you are pregnant? When was your last menstrual period?       N/a 12. TRAVEL: Have you traveled out of the country in the last month? (e.g., travel history, exposures)       N/a  Protocols used: Breathing Difficulty-A-AH, Chest Pain-A-AH

## 2024-02-24 NOTE — Telephone Encounter (Signed)
 Called and told pt rx sent to pharm. NFN

## 2024-02-24 NOTE — Telephone Encounter (Signed)
 Please see alternate NT encounter.

## 2024-02-24 NOTE — Telephone Encounter (Signed)
 Pt would like rx of preds   Called pt he stated he feels like he has a pulled muscle in his chest on the left side, Informed pt he should definitely go the the emergency room if he is having chest pain, pt stated its nothing like that with my heart  Pt has appt with Dr Darlean on the 11th and is fine with waiting for that appt.

## 2024-02-24 NOTE — Telephone Encounter (Signed)
 Copied from CRM [id number]. Topic: Clinical - Medication Refill >> Feb 24, 2024  8:30 AM Nathanel DEL wrote: Medication:  predniSONE  (DELTASONE ) 10 MG tablet   Has the patient contacted their pharmacy? Yes Pt states he spoke w/ someone last Tues and was told his Rx had been sent it.  But I do not see that it has. This is the patient's preferred pharmacy:  [facility] - [place], [place] - [address] [place] [address] Phone: [phone] Fax: [phone]  Is this the correct pharmacy for this prescription? No If no, delete pharmacy and type the correct one.   Has the prescription been filled recently? Yes  Is the patient out of the medication? Yes  Has the patient been seen for an appointment in the last year OR does the patient have an upcoming appointment? Yes  Can we respond through MyChart? No  Agent: Please be advised that Rx refills may take up to 3 business days. We ask that you follow-up with your pharmacy.

## 2024-03-07 NOTE — Progress Notes (Unsigned)
 Alm Bend, male    DOB: [birth year]    MRN: [id number]   Brief patient profile:  58yowm retired [employer]  quit smoking end of April 2025 /MM  referred to pulmonary clinic in [place]  02/11/2022 by G Zarwolo for copd eval / had seen Dr Vonzell  rx Vivia seemed to help in past, saba dep at 1st pulmonary eval with GOLD 3 criteria for COPD as of 02/2022    History of Present Illness  02/11/2022  Pulmonary/ 1st office eval/ Darlean / [place] [facility] no maint rx and last aecopd was 12/25/21  Chief Complaint  Patient presents with   Consult    Used to see Dr. Vonzell for COPD   Dyspnea:  walking at [employer] is a struggle  Cough: better p aecopd rx with doxy Sleep: level bed/ 2 pillows  SABA use: 2 puffs x 3 per day and neb up to bid  Rec Plan A = Automatic = Always=    Trelegy 100 one click take 2 good drags each am then rinse and gargle Plan B = Backup (to supplement plan A, not to replace it) Only use your albuterol  inhaler as a rescue medication Plan C = Crisis (instead of Plan B but only if Plan B stops working) - only use your albuterol  nebulizer if you first try Plan B and it fails to help  Ok to try albuterol  15 min before an activity (on alternating days)  that you know would usually make you short of breath  -  Allergy screen  12/25/21  >  Eos 0.4 - Labs ordered 02/11/2022  :    alpha one AT phenotype  MM level 177  - PFT's  03/26/22  FEV1 1.32  (36 % ) ratio 0.38  p 10 % improvement from saba p Trelegy prior to study with DLCO  9 (32%)  FV curve classically concave  - LDCT 03/06/22 RADS 2 MPN all < 7.2 mm     04/25/2023  post ER f/u ov/[place] office/[place]olan [place] re: GOLD 3 copd maint on breztri   and still smoking  Chief Complaint  Patient presents with   COPD    Gold 3  Dyspnea:  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  Work on inhaler technique:   Plan B = Backup (to supplement plan A, not to replace it) Only use your albuterol  inhaler as a rescue medication Plan C = Crisis (instead of Plan B but only if Plan B stops working) - only use your albuterol  nebulizer if you first try Plan B  Plan D = Deltasone    - when ABC not working well prednisone   10 mg x 2 each am until better then on daily x 5 days and stop  For cough/ congestion :  mucinex dm 1200 mg every 12 hours as needed  Please schedule a follow up office visit in 6 weeks, call sooner if needed with all medications /inhalers/ solutions in hand    06/02/2023  f/u ov/[place] office/Zonya Gudger re: GOLD 3 copd  maint on breztri    did  bring inhalers/ not on 02 but still smoking  Chief Complaint  Patient presents with   COPD GOLD 3  Dyspnea:  [employer] has to stop every other aisle/ uses [employer] parking   Cough: mucinex helping  Sleeping: flat bed 2 pillows s    resp cc  SABA use: way too much  02: none  Rec We will be referring your for 24 hour oxygen  starting with 2lpm at bedtime and make sure you check your oxygen  saturation  AT  your highest level of activity (not after you stop)   to be sure it stays over 90% and adjust  02 flow upward to maintain this level if needed but remember to turn it back to previous settings when you stop (to conserve your supply).  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 - do this with an empty inhaler  Plan D = Deltasone    - when ABC not working well prednisone   10 mg x 2 each am until better then one daily x 5 days and stop    Cannot smoke with 02 in the same room We will start you on 02 2lpm at bedtime and at rest  and 4lpm with walking  Please schedule a follow up office visit in 6 weeks, call sooner if needed - bring your portable system with you and your inhalers      07/14/2023  f/u ov/[place] office/Getsemani Lindon re: GOLD 3 copd  maint on Breztri  / stills smoking   Chief Complaint  Patient presents with   Follow-up    Follow up   Dyspnea:  no change / not wearing 02 as rec  Cough: none  Sleeping: flat bed 2 pillows s    resp cc  SABA use: no neb / no hfa / no pred x one week ran out  02: 2lpm at hs / prn  Rec Prednisone  10 mg   x 2 daily (as your same ceiling)  until better then 1 daily x 5 days then one-half daily as your new floor dose  Only use your albuterol  as a rescue medication Also  Ok to try albuterol  15 min before an activity (on alternating days)  that you know would usually make you short of breath  Make sure you check your oxygen  saturation  AT  your highest level of activity (not after you stop)   to be sure it stays over 90%     The key is to stop smoking completely before smoking completely stops you!   LDSCT  09/29/23  Continued waxing and waning bilateral pulmonary nodularity, with numerous new patchy irregular bilateral pulmonary nodules throughout both lungs, largest 18.6 mm in the posterior right upper lobe. Findings are likely infectious / inflammatory.   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 but POC no longer providing adequate sats  Cough: clear white min volume Sleeping: wedge pillow  s resp cc  SABA use: hfa 6 x daily / neb once a day  02: 5lpm concentrator  Rec Depomedrol 120 mg IM  Work on inhaler technique:   Remember plan D = ABC not working : Prednisone  10 mg   x 2 daily (your same ceiling)  until better then 1 daily x 5 days then one-half daily as your  floor dose  Levaquin  500 mg daily x 5 days then repeat x 5 days if no increase in joint pain especially achilles tendon  will be contacting you by phone for referral for Super   D CT chest  in 4 weeks = 12/08/23  We are referring you for best fit for portable 02  (doesn't have to be a concentrator)  Please schedule a follow up office visit in 6 weeks, call sooner if needed with all medications /inhalers/ solutions in hand     12/01/2023  f/u ov/[place] office/Norita Meigs re: GOLD 3/02 dep maint on trelegy 100 did not  bring meds - says not smoking at all / SPN f/u pending  Chief Complaint  Patient presents with   Shortness of Breath   Cough  Dyspnea:  sob at rest  Cough: clear white  Sleeping: flat bed/ wedge pillow s  resp cc  SABA use: neb bid/ hfa 2-3 x daily  02: 4lpm cont  and 4lpm POC Rec My office will be contacting you by phone for referral to [facility] for portable oxygen  ([phone])  - the goal is to keep the 02 saturation above 90%  - if you don't hear back from my office within one week please call us  back or notify us  thru MyChart and we'll address it right away.  Congratulations on not smoking, it's the most important aspect of your care We will try to get you Trelegy 100 thru [employer]  Your EPIC records support full disability and social security should have access to them already.   Please schedule a follow up visit in 3 months but call sooner if needed    03/08/2024  f/u ov/[place] office/Karlei Waldo re: GOLD 3/02 dep  maint on trelegy   Chief Complaint  Patient presents with   COPD    2 month f/u Trelegy not working - not any better since lov  Dyspnea:  some walking room to room / w/c when out  Cough: minimal rattling white mucus Sleeping: flat bed wedge pillow can't comfort even with neb  SABA use: twice a day  02: ***  Lung cancer screening: ***   No obvious day to day or daytime variability or assoc excess/ purulent sputum or mucus plugs or hemoptysis or cp or chest tightness, subjective wheeze or overt sinus or hb symptoms.    Also denies any obvious fluctuation of symptoms with weather or environmental changes or other aggravating or  alleviating factors except as outlined above   No unusual exposure hx or h/o childhood pna/ asthma or knowledge of premature birth.  Current Allergies, Complete Past Medical History, Past Surgical History, Family History, and Social History were reviewed in [name].  ROS  The following are not active complaints unless bolded Hoarseness, sore throat, dysphagia, dental problems, itching, sneezing,  nasal congestion or discharge of excess mucus or purulent secretions, ear ache,   fever, chills, sweats, unintended wt loss or wt gain, classically pleuritic or exertional cp,  orthopnea pnd or arm/hand swelling  or leg swelling, presyncope, palpitations, abdominal pain, anorexia, nausea, vomiting, diarrhea  or change in bowel habits or change in bladder habits, change in stools or change in urine, dysuria, hematuria,  rash, arthralgias, visual complaints, headache, numbness, weakness or ataxia or problems with walking or coordination,  change in mood or  memory.        Current Meds  Medication Sig   albuterol  (PROVENTIL ) (2.5 MG/3ML) 0.083% nebulizer solution Take 3 mLs (2.5 mg total) by nebulization  every 4 (four) hours as needed for wheezing or shortness of breath.   albuterol  (VENTOLIN  HFA) 108 (90 Base) MCG/ACT inhaler Inhale 2 puffs into the lungs every 4 (four) hours as needed for wheezing or shortness of breath.            Objective:    Wts  03/08/2024         ***  12/01/2023         110 10/20/2023       107  07/14/2023     119   06/02/2023       113  04/25/2023       109   01/24/2023       109   08/29/2022         120   05/20/22 118 lb 12.8 oz (53.9 kg)  03/27/22 121 lb 1.9 oz (54.9 kg)  02/11/22 119 lb 6.4 oz (54.2 kg)    Vital signs reviewed  03/08/2024  - Note at rest 02 sats  ***% on ***   General appearance:    ***    Mod barr***              Assessment

## 2024-03-08 ENCOUNTER — Ambulatory Visit (INDEPENDENT_AMBULATORY_CARE_PROVIDER_SITE_OTHER): Payer: Self-pay | Admitting: Internal Medicine

## 2024-03-08 ENCOUNTER — Encounter: Payer: Self-pay | Admitting: Internal Medicine

## 2024-03-08 VITALS — BP 109/53 | HR 102 | Ht 69.0 in | Wt 114.6 lb

## 2024-03-08 DIAGNOSIS — F17210 Nicotine dependence, cigarettes, uncomplicated: Secondary | ICD-10-CM

## 2024-03-08 DIAGNOSIS — J449 Chronic obstructive pulmonary disease, unspecified: Secondary | ICD-10-CM

## 2024-03-08 DIAGNOSIS — R918 Other nonspecific abnormal finding of lung field: Secondary | ICD-10-CM

## 2024-03-08 DIAGNOSIS — J9611 Chronic respiratory failure with hypoxia: Secondary | ICD-10-CM

## 2024-03-08 MED ORDER — PREDNISONE 10 MG PO TABS: ORAL_TABLET | ORAL | 2 refills | Status: DC

## 2024-03-08 MED ORDER — TRIAMTERENE-HCTZ 37.5-25 MG PO TABS: 1.0000 | ORAL_TABLET | Freq: Every day | ORAL | 0 refills | Status: DC

## 2024-03-08 MED ORDER — BREZTRI AEROSPHERE 160-9-4.8 MCG/ACT IN AERO: 2.0000 | INHALATION_SPRAY | Freq: Two times a day (BID) | RESPIRATORY_TRACT | 3 refills | Status: AC

## 2024-03-08 MED ORDER — BREZTRI AEROSPHERE 160-9-4.8 MCG/ACT IN AERO: 2.0000 | INHALATION_SPRAY | Freq: Two times a day (BID) | RESPIRATORY_TRACT | Status: AC

## 2024-03-08 MED ORDER — PREDNISONE 10 MG PO TABS: ORAL_TABLET | ORAL | 0 refills | Status: DC

## 2024-03-08 NOTE — Patient Instructions (Addendum)
 Plan A = Automatic = Always=  Breztri  Take 2 puffs first thing in am and then another 2 puffs about 12 hours later.    Work on inhaler technique:  relax and gently blow all the way out then take a nice smooth full deep breath back in, triggering the inhaler at same time you start breathing in.  Hold breath in for at least  5 seconds if you can. Blow out breztr thru nose. Rinse and gargle with water when done.  If mouth or throat bother you at all,  try brushing teeth/gums/tongue with arm and hammer toothpaste/ make a slurry and gargle and spit out.      Plan B = Backup (to supplement plan A, not to replace it) Use your albuterol  inhaler as a rescue medication to be used if you can't catch your breath by resting or slowing your pace  or doing a relaxed purse lip breathing pattern.  - The less you use it, the better it will work when you need it. - Ok to use the inhaler up to 2 puffs  every 4 hours if you must but call for appointment if use goes up over your usual need - Don't leave home without it !!  (think of it like the spare tire or starter fluid for your car)   Plan C = Crisis (instead of Plan B but only if Plan B stops working) - only use your albuterol  nebulizer if you first try Plan B and it fails to help > ok to use the nebulizer up to every 4 hours but if start needing it regularly call for immediate appointment   Plan D = Deltasone   ok to use ABC working  Prednisone  10 mg   x 2 daily  until better then 1 daily x 5 days then one-half daily as your  floor dose    Make sure you check your oxygen  saturation  AT  your highest level of activity (not after you stop)   to be sure it stays over 90% and adjust  02 flow upward to maintain this level if needed but remember to turn it back to previous settings when you stop (to conserve your supply).    Please schedule a follow up office visit in 4 weeks, sooner if needed  with all medications /inhalers/ solutions in hand so we can verify  exactly what you are taking. This includes all medications from all doctors and over the counters

## 2024-03-09 NOTE — Assessment & Plan Note (Addendum)
 Active smoker/MM -  Allergy screen  12/25/21  >  Eos 0.4 - Labs ordered 02/11/2022  :    alpha one AT phenotype  MM level 177  - 02/11/2022    try trelegy 100 one click each am  - PFT's  03/26/22  FEV1 1.32  (36 % ) ratio 0.38  p 10 % improvement from saba p Trelegy prior to study with DLCO  9 (32%)  FV curve classically concave  - 05/20/2022  After extensive coaching inhaler device,  effectiveness =    90% with hfa > changed to breztri  due to hoarseness on Trelegy > preferred  Trelegy 08/29/2022 so ok to resume - 01/24/2023   too much saba hfa while on trelegy 200 so change to breztri  again with approp saba this time and Predisone x 6 day cycles as Plan C > 04/25/2023 did not follow ABC plan - 04/25/2023  After extensive coaching inhaler device,  effectiveness =    60% from a basline of 30%  (short Ti).  ABCD plan : A is Breztri  and D -= 20 mg daily until better then 1 daily x 5 days only  -  07/14/2023  After extensive coaching inhaler device,  effectiveness =  60% (short Ti)  - 07/14/2023 changed floor prednisone  dose to 5 mg > ran out as of 09/2023 and did not refill but doing a lot better on it vs off - 10/20/2023  After extensive coaching inhaler device,  effectiveness =    60% (short ti) > continue breztri  with low theshhold to change to triple rx per neb and resume Plan D as above  - CT  01/19/24 Severe centrilobular and paraseptal emphysema - 03/08/2024  After extensive coaching inhaler device,  effectiveness =    60% with hfa/ prefers Breztri  vs trelegy and referred to [facility] to get it   Clearly  Group D (now reclassified as E) in terms of symptom/risk and laba/lama/ICS  therefore appropriate rx at this point >>>  bretri and more approp saba    Add back prednisone  as Plan D and also with floor dose now of 5 mg

## 2024-03-09 NOTE — Assessment & Plan Note (Addendum)
 LDSCT chest  03/06/22  1. Lung-RADS 3, probably benign findings. Short-term follow-up in 6 months is recommended with repeat low-dose chest CT without contrast (please use the following order, CT CHEST LCS NODULE FOLLOW-UP W/O CM). Multiple pulmonary nodules of maximally volume derived equivalent diameter 7.4 mm>> repeat 09/25/22  wax/ wane pattern Repeat  09/29/23 Continued waxing and waning bilateral pulmonary nodularity, with numerous new patchy irregular bilateral pulmonary nodules throughout both lungs, largest 18.6 mm in the posterior right upper lobe. Findings are likely infectious / inflammatory. - 10/20/2023 rx levaquin  500 x 5 day x 2 cycles then Super CD CT > pending for 12/08/23 - CT  01/19/24  c/w inflammatory nodules > repeat in 3 m requested by [facility] program   Scan due 04/02/24 will f/u w/a

## 2024-03-09 NOTE — Assessment & Plan Note (Addendum)
 Qualified for 02 06/02/23 06/02/2023  sats 79% RA then required 2lpm for sats 91% at rest  then patient walked at moderate pace for 3 laps, desat to 81% on 3L, placed on 4L for last lap and sats > 90% - 06/02/2023 rec 2lpm at rest and up to 4lpm cont walking   - 10/20/2023  sats 85% on POC 2lpm > referred for best fit eval> again 12/01/2023   He has become so sedentary that I don't think he needs more than 4lpm POC but will recheck once on a stable copd rx at next ov re 02 needs           Each maintenance medication was reviewed in detail including emphasizing most importantly the difference between maintenance and prns and under what circumstances the prns are to be triggered using an action plan format where appropriate.  Total time for H and P, chart review, counseling, reviewing hfa/neb/02/ pulse ox  device(s) and generating customized AVS unique to this office visit / same day charting = 32 min

## 2024-03-10 ENCOUNTER — Telehealth: Payer: Self-pay

## 2024-03-10 ENCOUNTER — Other Ambulatory Visit: Payer: Self-pay

## 2024-03-10 DIAGNOSIS — J449 Chronic obstructive pulmonary disease, unspecified: Secondary | ICD-10-CM

## 2024-03-10 MED ORDER — PREDNISONE 10 MG PO TABS: ORAL_TABLET | ORAL | 2 refills | Status: AC

## 2024-03-10 MED ORDER — TRIAMTERENE-HCTZ 37.5-25 MG PO TABS
1.0000 | ORAL_TABLET | Freq: Every day | ORAL | 0 refills | Status: AC
Start: 2024-03-10 — End: ?

## 2024-03-10 NOTE — Telephone Encounter (Signed)
 Copied from CRM [id number]. Topic: Clinical - Prescription Issue >> Mar 10, 2024 10:33 AM Corean SAUNDERS wrote: Reason for CRM: Patient states [facility] advised him that his Prednisone  prescription was cancelled and he does not know why but has not been taking the medication for a few days now and is very frustrated. Please call patient back and advise.  Called and spoke with pharmacy that pt didn't want it to go there he wanted another pharmacy, but when I called pt he wanted it to be at [facility]. Resent medication to [facility]

## 2024-03-23 ENCOUNTER — Ambulatory Visit: Payer: Self-pay | Admitting: Internal Medicine

## 2024-03-23 NOTE — Telephone Encounter (Signed)
 FYI Only or Action Required?: FYI only for provider.  Patient is followed in Pulmonology for COPD, last seen on 03/08/2024 by Darlean Ozell NOVAK, MD.  Called Nurse Triage reporting Hallucinations and Insomnia.  Symptoms began several days ago.  Interventions attempted: Home oxygen  use.  Symptoms are: rapidly worsening.  Triage Disposition: Go to ED Now (Notify PCP)  Patient/caregiver understands and will follow disposition?: Unsure       Copied from CRM [id number]. Topic: Clinical - Red Word Triage >> Mar 23, 2024  4:42 PM Rozanna MATSU wrote: Red Word that prompted transfer to Nurse Triage: HALLUCINATION, RESTLESSNESS, NOT SLEEPING Reason for Disposition  [1] Hallucinations AND [2] unable to do any of normal activities (e.g., self-care, school, work; in comparison to baseline)  Answer Assessment - Initial Assessment Questions E2C2 Pulmonary Triage - Initial Assessment Questions Chief Complaint (e.g., cough, sob, wheezing, fever, chills, sweat or additional symptoms) *Go to specific symptom protocol after initial questions. Insomnia, restless, talking to himself, hallucinating Swollen feet/ankles  How long have symptoms been present? 3-4 nights  Have you tested for COVID or Flu? Note: If not, ask patient if a home test can be taken. If so, instruct patient to call back for positive results. No  MEDICINES:   Have you used any OTC meds to help with symptoms? No If yes, ask What medications? N/a    OXYGEN : Do you wear supplemental oxygen ? Yes If yes, How many liters are you supposed to use? See below  Do you monitor your oxygen  levels? Yes If yes, What is your reading (oxygen  level) today? 4L ATC 65 when he takes off O2 d/t hallucinations Unknown SpO2 with O2 on  What is your usual oxygen  saturation reading?  (Note: Pulmonary O2 sats should be 90% or greater) N/a        1. MAIN CONCERN: What happened that made you call today?      hallucinations 2. DESCRIPTION: What are the hallucinations like? Describe them for me. (e.g., auditory, visual, tactile; worsening; threatening) If auditory, ask Are they telling you to hurt yourself or someone else?     talking to himself, hallucinating 3. ONSET: When did this start? (e.g., hours, days, months)     See above 4. PATTERN: Do they come and go, or are they present all the time?  Are they present now?     Present all the time 5. PRIOR HALLUCINATIONS: Have you had hallucinations in the past? (e.g., same, different, worse)     denies 6. MENTAL HEALTH HISTORY: Have you ever been diagnosed with schizophrenia or any other mental health problem? (e.g., bipolar disorder, schizophrenia; dementia, Parkinsons)     denies 7. MENTAL HEALTH MEDICINES: Are you taking any medicines for depression or other mental health problems? (e.g., psychiatric medicines; sleeping pills)     denies 8. ALCOHOL or DRUG ABUSE: Have you been drinking alcohol or taking any drugs? Have you recently stopped drinking alcohol or using drugs?     denies 9. MEDICINE CHANGES: Did you recently stop taking a medicine? (e.g., antidepressant, barbiturates, benzodiazepine, gabapentin)  Did you recently start a new medicine or increase the dose? (e.g., antidepressant, digoxin, sleep medicine, steroid, Parkinson's medicine)     Endorses on prednisone  - wife reports has been on for 4-5 months, albuterol , kidney medication 10. SUPPORT: Is there anyone else with you?       Home with wife 14. STRESS: Are you experiencing any particular stressors?       Lack of sleep 12. OTHER  SYMPTOMS: Are there any other symptoms? (e.g., difficulty breathing, headache, fever, weakness)       Forgetfulness, endorses breathing fast... then calms down    Pt wife, Katheryn calling with concerns of insomnia and hallucinations. Wife reports that he has only slept for a few hours in the past 3-4 days. Denies any psych hx or  medications hx. Does endorse being on prednisone  for a few months now. Wife reports that he frequently takes off home O2 and SpO2 dips in the 60's.  Triager strongly advised ED and offered to call 911, but wife declined.  Protocols used: Hallucinations-A-AH

## 2024-03-25 NOTE — Telephone Encounter (Signed)
 Called patient left a voicemail to call office to schedule an in office visit.

## 2024-04-02 ENCOUNTER — Ambulatory Visit (HOSPITAL_COMMUNITY)
Admission: RE | Admit: 2024-04-02 | Discharge: 2024-04-02 | Disposition: A | Discharge status: Home / Self Care | Payer: Self-pay | Source: Ambulatory Visit | Attending: Internal Medicine | Admitting: Internal Medicine

## 2024-04-02 DIAGNOSIS — R918 Other nonspecific abnormal finding of lung field: Secondary | ICD-10-CM | POA: Insufficient documentation

## 2024-04-04 NOTE — Progress Notes (Deleted)
 Jerry Schaefer, male    DOB: 1966/03/06    MRN: [id number]   Brief patient profile:  89 yowm retired truck driver  quit smoking end of April 2025 /MM  referred to pulmonary clinic in [place]  02/11/2022 by G Zarwolo for copd eval / had seen Dr Vonzell  rx Anoro seemed to help in past, saba dep at 1st pulmonary eval with GOLD 3 criteria for COPD as of 02/2022    History of Present Illness  02/11/2022  Pulmonary/ 1st office eval/ Darlean / [place] [facility] no maint rx and last aecopd was 12/25/21  Chief Complaint  Patient presents with   Consult    Used to see Dr. Vonzell for COPD   Dyspnea:  walking at [employer] is a struggle  Cough: better p aecopd rx with doxy Sleep: level bed/ 2 pillows  SABA use: 2 puffs x 3 per day and neb up to bid  Rec Plan A = Automatic = Always=    Trelegy 100 one click take 2 good drags each am then rinse and gargle Plan B = Backup (to supplement plan A, not to replace it) Only use your albuterol  inhaler as a rescue medication Plan C = Crisis (instead of Plan B but only if Plan B stops working) - only use your albuterol  nebulizer if you first try Plan B and it fails to help  Ok to try albuterol  15 min before an activity (on alternating days)  that you know would usually make you short of breath  -  Allergy screen  12/25/21  >  Eos 0.4 - Labs ordered 02/11/2022  :    alpha one AT phenotype  MM level 177  - PFT's  03/26/22  FEV1 1.32  (36 % ) ratio 0.38  p 10 % improvement from saba p Trelegy prior to study with DLCO  9 (32%)  FV curve classically concave  - LDCT 03/06/22 RADS 2 MPN all < 7.2 mm     04/25/2023  post ER f/u ov/[place] office/Jerry Schaefer re: GOLD 3 copd maint on breztri   and still smoking  Chief Complaint  Patient presents with   COPD    Gold 3  Dyspnea:  59ft and gives out / still truck driving  Cough: thick and white esp in am    Sleeping: flat bed 2 pillows s   resp cc  SABA use: 4x days  02: none Lung cancer screening: referred  Rec Plan A =  Automatic = Always=    Breztri  Take 2 puffs first thing in am and then another 2 puffs about 12 hours later.  Work on inhaler technique:   Plan B = Backup (to supplement plan A, not to replace it) Only use your albuterol  inhaler as a rescue medication Plan C = Crisis (instead of Plan B but only if Plan B stops working) - only use your albuterol  nebulizer if you first try Plan B  Plan D = Deltasone    - when ABC not working well prednisone   10 mg x 2 each am until better then on daily x 5 days and stop  For cough/ congestion :  mucinex dm 1200 mg every 12 hours as needed  Please schedule a follow up office visit in 6 weeks, call sooner if needed with all medications /inhalers/ solutions in hand    06/02/2023  f/u ov/[place] office/Jerry Schaefer re: GOLD 3 copd  maint on breztri    did  bring inhalers/ not on 02 but still smoking  Chief  Complaint  Patient presents with   COPD GOLD 3  Dyspnea:  [employer] has to stop every other aisle/ uses [employer] parking   Cough: mucinex helping  Sleeping: flat bed 2 pillows s    resp cc  SABA use: way too much  02: none  Rec We will be referring your for 24 hour oxygen  starting with 2lpm at bedtime and make sure you check your oxygen  saturation  AT  your highest level of activity (not after you stop)   to be sure it stays over 90% and adjust  02 flow upward to maintain this level if needed but remember to turn it back to previous settings when you stop (to conserve your supply).  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 - do this with an empty inhaler  Plan D = Deltasone    - when ABC not working well prednisone   10 mg x 2 each am until better then one daily x 5 days and stop    Cannot smoke with 02 in the same room We will start you on 02 2lpm at bedtime and at rest  and 4lpm with walking  Please schedule a follow up office visit in 6 weeks, call sooner if needed - bring your portable system with you and your inhalers      07/14/2023  f/u ov/[place] office/Jerry Schaefer re: GOLD 3 copd  maint on Breztri  / stills smoking   Chief Complaint  Patient presents with   Follow-up    Follow up   Dyspnea:  no change / not wearing 02 as rec  Cough: none  Sleeping: flat bed 2 pillows s    resp cc  SABA use: no neb / no hfa / no pred x one week ran out  02: 2lpm at hs / prn  Rec Prednisone  10 mg   x 2 daily (as your same ceiling)  until better then 1 daily x 5 days then one-half daily as your new floor dose  Only use your albuterol  as a rescue medication Also  Ok to try albuterol  15 min before an activity (on alternating days)  that you know would usually make you short of breath  Make sure you check your oxygen  saturation  AT  your highest level of activity (not after you stop)   to be sure it stays over 90%     The key is to stop smoking completely before smoking completely stops you!   LDSCT  09/29/23  Continued waxing and waning bilateral pulmonary nodularity, with numerous new patchy irregular bilateral pulmonary nodules throughout both lungs, largest 18.6 mm in the posterior right upper lobe. Findings are likely infectious / inflammatory.   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 but POC no longer providing adequate sats  Cough: clear white min volume Sleeping: wedge pillow  s resp cc  SABA use: hfa 6 x daily / neb once a day  02: 5lpm concentrator  Rec Depomedrol 120 mg IM  Work on inhaler technique:   Remember plan D = ABC not working : Prednisone  10 mg   x 2 daily (your same ceiling)  until better then 1 daily x 5 days then one-half daily as your  floor dose  Levaquin  500 mg daily x 5 days then repeat x 5 days if no increase in joint pain especially achilles tendon  will be contacting you by phone for referral for Super   D CT chest  in 4 weeks = 12/08/23  We are referring you for best fit for portable 02  (doesn't have to be a concentrator)  Please schedule a follow up office visit in 6 weeks, call sooner if needed with all medications /inhalers/ solutions in hand     12/01/2023  f/u ov/[place] office/Jerry Schaefer re: GOLD 3/02 dep maint on trelegy 100 did not  bring meds - says not smoking at all / SPN f/u pending  Chief Complaint  Patient presents with   Shortness of Breath   Cough  Dyspnea:  sob at rest  Cough: clear white  Sleeping: flat bed/ wedge pillow s  resp cc  SABA use: neb bid/ hfa 2-3 x daily  02: 4lpm cont  and 4lpm POC Rec My office will be contacting you by phone for referral to [facility] for portable oxygen  ([phone])  - the goal is to keep the 02 saturation above 90%  - if you don't hear back from my office within one week please call us  back or notify us  thru MyChart and we'll address it right away.  Congratulations on not smoking, it's the most important aspect of your care We will try to get you Trelegy 100 thru [employer]  Your EPIC records support full disability and social security should have access to them already.   Please schedule a follow up visit in 3 months but call sooner if needed    03/08/2024  f/u ov/[place] office/Jerry Schaefer re: GOLD 3/02 dep  maint on no meds   Chief Complaint  Patient presents with   COPD    2 month f/u Trelegy not working - not any better since lov  Dyspnea:  some walking room to room / w/c when out  Cough: minimal rattling white mucus Sleeping: flat bed wedge pillow can't get breathing comfort even with neb  SABA use: twice a day neb/ once nightly neb  02: 4lpm conc and POC  LCS: in program  Rec Plan A = Automatic = Always=  Breztri   .   Work on inhaler technique:      Plan B = Backup (to supplement plan A, not to replace it) Use your albuterol  inhaler as a rescue medication Plan C = Crisis (instead of Plan B but only if Plan B stops working) -  only use your albuterol  nebulizer if you first try Plan B  Plan D = Deltasone   ok to use ABC working  Prednisone  10 mg   x 2 daily  until better then 1 daily x 5 days then one-half daily as your  floor dose  Make sure you check your oxygen  saturation  AT  your highest level of activity (not after you stop)   to be sure it stays over 90% Please schedule a follow up office visit in 4 weeks, sooner if needed  with all medications /inhalers/ solutions in hand   - CT chest   01/19/24  c/w inflammatory nodules > repeat in 3 m requested by LCS program  - CT f/u  done  04/02/2024  and no change in LLL sup segment nodule   9 mm rec PET but this will not distinguish malignancy vs inflammatory in this pt with waxing and waning nodules so refer to interventionist for consideration  of bx   04/05/2024  f/u ov/[place] office/Jerry Schaefer re: *** maint on ***  No chief complaint on file.   Dyspnea:  *** Cough: *** Sleeping: ***   resp cc  SABA use: *** 02: ***  Lung cancer screening: ***   No obvious day to day or daytime variability or assoc excess/ purulent sputum or mucus plugs or hemoptysis or cp or chest tightness, subjective wheeze or overt sinus or hb symptoms.    Also denies any obvious fluctuation of symptoms with weather or environmental changes or other aggravating or alleviating factors except as outlined above   No unusual exposure hx or h/o childhood pna/ asthma or knowledge of premature birth.  Current Allergies, Complete Past Medical History, Past Surgical History, Family History, and Social History were reviewed in [name].  ROS  The following are not active complaints unless bolded Hoarseness, sore throat, dysphagia, dental problems, itching, sneezing,  nasal congestion or discharge of excess mucus or purulent secretions, ear ache,   fever, chills, sweats, unintended wt loss or wt gain, classically pleuritic or exertional cp,  orthopnea pnd or arm/hand  swelling  or leg swelling, presyncope, palpitations, abdominal pain, anorexia, nausea, vomiting, diarrhea  or change in bowel habits or change in bladder habits, change in stools or change in urine, dysuria, hematuria,  rash, arthralgias, visual complaints, headache, numbness, weakness or ataxia or problems with walking or coordination,  change in mood or  memory.        No outpatient medications have been marked as taking for the 04/05/24 encounter (Appointment) with Melany Wiesman B, MD.           Objective:    Wts   04/05/2024          ***  03/08/2024       114  12/01/2023         110 10/20/2023       107  07/14/2023     119   06/02/2023       113  04/25/2023       109   01/24/2023       109   08/29/2022         120   05/20/22 118 lb 12.8 oz (53.9 kg)  03/27/22 121 lb 1.9 oz (54.9 kg)  02/11/22 119 lb 6.4 oz (54.2 kg)    Vital signs reviewed  04/05/2024  - Note at rest 02 sats  ***% on ***   General appearance:    ***   Mod bar***          Assessment

## 2024-04-05 ENCOUNTER — Ambulatory Visit: Payer: Self-pay | Admitting: Internal Medicine

## 2024-04-05 DIAGNOSIS — R918 Other nonspecific abnormal finding of lung field: Secondary | ICD-10-CM

## 2024-04-05 DIAGNOSIS — J449 Chronic obstructive pulmonary disease, unspecified: Secondary | ICD-10-CM

## 2024-04-05 DIAGNOSIS — J9611 Chronic respiratory failure with hypoxia: Secondary | ICD-10-CM

## 2024-04-13 ENCOUNTER — Telehealth: Payer: Self-pay | Admitting: Internal Medicine

## 2024-04-13 NOTE — Telephone Encounter (Signed)
 Copied from CRM [id number]. Topic: General - Deceased Patient >> 04/16/2024 12:32 PM Corean SAUNDERS wrote: Name of caller: Terri  Date of death: 04/10/24 at 1:34 PM   Name of funeral home: [facility]   Phone number of funeral home: [phone]  Provider that needs to sign form: Dr. Darlean  Timeline for signing: ASAP

## 2024-04-18 NOTE — Progress Notes (Deleted)
 Jerry Schaefer, male    DOB: 1966/06/05    MRN: [id number]   Brief patient profile:  56 yowm retired truck driver  quit smoking end of April 2025 /MM  referred to pulmonary clinic in [place]  02/11/2022 by G Zarwolo for copd eval / had seen Dr Vonzell  rx Anoro seemed to help in past, saba dep at 1st pulmonary eval with GOLD 3 criteria for COPD as of 02/2022    History of Present Illness  02/11/2022  Pulmonary/ 1st office eval/ [place] / [facility] no maint rx and last aecopd was 12/25/21  Chief Complaint  Patient presents with   Consult    Used to see Dr. Vonzell for COPD   Dyspnea:  walking at [employer] is a struggle  Cough: better p aecopd rx with doxy Sleep: level bed/ 2 pillows  SABA use: 2 puffs x 3 per day and neb up to bid  Rec Plan A = Automatic = Always=    Trelegy 100 one click take 2 good drags each am then rinse and gargle Plan B = Backup (to supplement plan A, not to replace it) Only use your albuterol  inhaler as a rescue medication Plan C = Crisis (instead of Plan B but only if Plan B stops working) - only use your albuterol  nebulizer if you first try Plan B and it fails to help  Ok to try albuterol  15 min before an activity (on alternating days)  that you know would usually make you short of breath  -  Allergy screen  12/25/21  >  Eos 0.4 - Labs ordered 02/11/2022  :    alpha one AT phenotype  MM level 177  - PFT's  03/26/22  FEV1 1.32  (36 % ) ratio 0.38  p 10 % improvement from saba p Trelegy prior to study with DLCO  9 (32%)  FV curve classically concave  - LDCT 03/06/22 RADS 2 MPN all < 7.2 mm     04/25/2023  post ER f/u ov/[place] office/Kaysea Raya re: GOLD 3 copd maint on breztri   and still smoking  Chief Complaint  Patient presents with   COPD    Gold 3  Dyspnea:  12ft and gives out / still truck driving  Cough: thick and white esp in am    Sleeping: flat bed 2 pillows s   resp cc  SABA use: 4x days  02: none Lung cancer screening: referred  Rec Plan A =  Automatic = Always=    Breztri  Take 2 puffs first thing in am and then another 2 puffs about 12 hours later.  Work on inhaler technique:   Plan B = Backup (to supplement plan A, not to replace it) Only use your albuterol  inhaler as a rescue medication Plan C = Crisis (instead of Plan B but only if Plan B stops working) - only use your albuterol  nebulizer if you first try Plan B  Plan D = Deltasone    - when ABC not working well prednisone   10 mg x 2 each am until better then on daily x 5 days and stop  For cough/ congestion :  mucinex dm 1200 mg every 12 hours as needed  Please schedule a follow up office visit in 6 weeks, call sooner if needed with all medications /inhalers/ solutions in hand    06/02/2023  f/u ov/[place] office/Malania Gawthrop re: GOLD 3 copd  maint on breztri    did  bring inhalers/ not on 02 but still smoking  Chief  Complaint  Patient presents with   COPD GOLD 3  Dyspnea:  [employer] has to stop every other aisle/ uses [employer] parking   Cough: mucinex helping  Sleeping: flat bed 2 pillows s    resp cc  SABA use: way too much  02: none  Rec We will be referring your for 24 hour oxygen  starting with 2lpm at bedtime and make sure you check your oxygen  saturation  AT  your highest level of activity (not after you stop)   to be sure it stays over 90% and adjust  02 flow upward to maintain this level if needed but remember to turn it back to previous settings when you stop (to conserve your supply).  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 - do this with an empty inhaler  Plan D = Deltasone    - when ABC not working well prednisone   10 mg x 2 each am until better then one daily x 5 days and stop    Cannot smoke with 02 in the same room We will start you on 02 2lpm at bedtime and at rest  and 4lpm with walking  Please schedule a follow up office visit in 6 weeks, call sooner if needed - bring your portable system with you and your inhalers     LDSCT  09/29/23  Continued waxing and waning bilateral pulmonary nodularity, with numerous new patchy irregular bilateral pulmonary nodules throughout both lungs, largest 18.6 mm in the posterior right upper lobe. Findings are likely infectious / inflammatory.  12/01/2023  f/u ov/[place] office/Stina Gane re: GOLD 3/02 dep maint on trelegy 100 did not  bring meds - says not smoking at all / SPN f/u pending  Chief Complaint  Patient presents with   Shortness of Breath   Cough  Dyspnea:  sob at rest  Cough: clear white  Sleeping: flat bed/ wedge pillow s  resp cc  SABA use: neb bid/ hfa 2-3 x daily  02: 4lpm cont  and 4lpm POC Rec My office will be contacting you by phone for referral to [facility] for portable oxygen  ([phone])  - the goal is to keep the 02 saturation above 90%  - if you don't hear back from my office within one week please call us  back or notify us  thru MyChart and we'll address it right away.  Congratulations on not smoking, it's the most important aspect of your care We will try to get you Trelegy 100 thru [employer]  Your EPIC records support full disability and social security should have access to them already.   Please schedule a follow up visit in 3 months but call sooner if needed    03/08/2024  f/u ov/[place] office/Niralya Ohanian re: GOLD 3/02 dep  maint on no meds   Chief Complaint  Patient presents with   COPD    2 month f/u Trelegy not working - not any better since lov  Dyspnea:  some walking room to room / w/c when out  Cough: minimal rattling white mucus Sleeping: flat bed wedge pillow can't get breathing comfort even with neb  SABA use: twice a day neb/ once nightly neb  02: 4lpm conc and POC  LCS: in program  Rec Plan A = Automatic = Always=  Breztri   .   Work on inhaler technique:     Plan B = Backup (to supplement plan A, not to replace it) Use your albuterol  inhaler as a rescue  medication Plan C = Crisis (instead of Plan B but only if Plan B stops  working) - only use your albuterol  nebulizer if you first try Plan B  Plan D = Deltasone   ok to use ABC working  Prednisone  10 mg   x 2 daily  until better then 1 daily x 5 days then one-half daily as your  floor dose  Make sure you check your oxygen  saturation  AT  your highest level of activity (not after you stop)   to be sure it stays over 90% Please schedule a follow up office visit in 4 weeks, sooner if needed  with all medications /inhalers/ solutions in hand   - CT chest   01/19/24  c/w inflammatory nodules > repeat in 3 m requested by LCS program  - CT f/u  done  04/02/2024  and no change in LLL sup segment nodule   9 mm rec PET but this will not distinguish malignancy vs inflammatory in this pt with waxing and waning nodules so refer to interventionist for consideration of bx   04/22/2024  f/u ov/[place] office/Omero Kowal re: *** maint on ***  No chief complaint on file.   Dyspnea:  *** Cough: *** Sleeping: ***   resp cc  SABA use: *** 02: ***  Lung cancer screening: ***   No obvious day to day or daytime variability or assoc excess/ purulent sputum or mucus plugs or hemoptysis or cp or chest tightness, subjective wheeze or overt sinus or hb symptoms.    Also denies any obvious fluctuation of symptoms with weather or environmental changes or other aggravating or alleviating factors except as outlined above   No unusual exposure hx or h/o childhood pna/ asthma or knowledge of premature birth.  Current Allergies, Complete Past Medical History, Past Surgical History, Family History, and Social History were reviewed in [name].  ROS  The following are not active complaints unless bolded Hoarseness, sore throat, dysphagia, dental problems, itching, sneezing,  nasal congestion or discharge of excess mucus or purulent secretions, ear ache,   fever, chills, sweats, unintended wt loss or wt gain, classically pleuritic or exertional cp,  orthopnea pnd or  arm/hand swelling  or leg swelling, presyncope, palpitations, abdominal pain, anorexia, nausea, vomiting, diarrhea  or change in bowel habits or change in bladder habits, change in stools or change in urine, dysuria, hematuria,  rash, arthralgias, visual complaints, headache, numbness, weakness or ataxia or problems with walking or coordination,  change in mood or  memory.        No outpatient medications have been marked as taking for the 04/22/24 encounter (Appointment) with Taegen Delker B, MD.           Objective:    Wts   04/22/2024       ***  03/08/2024       114  12/01/2023         110 10/20/2023       107  07/14/2023     119   06/02/2023       113  04/25/2023       109   01/24/2023       109   08/29/2022         120   05/20/22 118 lb 12.8 oz (53.9 kg)  03/27/22 121 lb 1.9 oz (54.9 kg)  02/11/22 119 lb 6.4 oz (54.2 kg)    Vital signs reviewed  04/22/2024  - Note at rest 02 sats  ***%  on ***   General appearance:    ***   Mod bar***          Assessment

## 2024-04-22 ENCOUNTER — Ambulatory Visit: Payer: Self-pay | Admitting: Internal Medicine

## 2024-04-28 DEATH — deceased
